# Patient Record
Sex: Female | Born: 1975
Health system: Southern US, Community
[De-identification: ages and names within clinical notes are randomized; demographics above are authoritative.]

## PROBLEM LIST (undated history)

## (undated) ENCOUNTER — Emergency Department (HOSPITAL_COMMUNITY): Disposition: A | Discharge status: Home / Self Care | Payer: Self-pay

## (undated) DIAGNOSIS — M1990 Unspecified osteoarthritis, unspecified site: Secondary | ICD-10-CM

## (undated) DIAGNOSIS — F909 Attention-deficit hyperactivity disorder, unspecified type: Secondary | ICD-10-CM

## (undated) DIAGNOSIS — T8859XA Other complications of anesthesia, initial encounter: Secondary | ICD-10-CM

## (undated) DIAGNOSIS — R519 Headache, unspecified: Secondary | ICD-10-CM

## (undated) DIAGNOSIS — E039 Hypothyroidism, unspecified: Secondary | ICD-10-CM

## (undated) DIAGNOSIS — E079 Disorder of thyroid, unspecified: Secondary | ICD-10-CM

## (undated) HISTORY — PX: OTHER SURGICAL HISTORY: SHX169

## (undated) HISTORY — PX: BREAST SURGERY: SHX581

## (undated) HISTORY — PX: WISDOM TOOTH EXTRACTION: SHX21

## (undated) HISTORY — PX: REDUCTION MAMMAPLASTY: SUR839

---

## 1998-02-25 ENCOUNTER — Emergency Department (HOSPITAL_COMMUNITY): Admission: EM | Admit: 1998-02-25 | Discharge: 1998-02-26 | Payer: Self-pay | Admitting: Emergency Medicine

## 1998-03-06 ENCOUNTER — Emergency Department (HOSPITAL_COMMUNITY): Admission: EM | Admit: 1998-03-06 | Discharge: 1998-03-07 | Payer: Self-pay | Admitting: Emergency Medicine

## 2002-10-26 ENCOUNTER — Encounter: Payer: Self-pay | Admitting: Obstetrics and Gynecology

## 2002-10-26 ENCOUNTER — Ambulatory Visit (HOSPITAL_COMMUNITY): Admission: RE | Admit: 2002-10-26 | Discharge: 2002-10-26 | Payer: Self-pay | Admitting: Obstetrics and Gynecology

## 2003-03-28 ENCOUNTER — Inpatient Hospital Stay (HOSPITAL_COMMUNITY): Admission: AD | Admit: 2003-03-28 | Discharge: 2003-03-28 | Payer: Self-pay | Admitting: Obstetrics and Gynecology

## 2003-03-29 ENCOUNTER — Inpatient Hospital Stay (HOSPITAL_COMMUNITY): Admission: AD | Admit: 2003-03-29 | Discharge: 2003-04-02 | Payer: Self-pay | Admitting: Obstetrics and Gynecology

## 2003-04-03 ENCOUNTER — Encounter: Admission: RE | Admit: 2003-04-03 | Discharge: 2003-05-03 | Payer: Self-pay | Admitting: Obstetrics and Gynecology

## 2003-05-04 ENCOUNTER — Encounter: Admission: RE | Admit: 2003-05-04 | Discharge: 2003-06-03 | Payer: Self-pay | Admitting: Obstetrics and Gynecology

## 2003-07-04 ENCOUNTER — Encounter: Admission: RE | Admit: 2003-07-04 | Discharge: 2003-08-03 | Payer: Self-pay | Admitting: Obstetrics and Gynecology

## 2003-09-03 ENCOUNTER — Encounter: Admission: RE | Admit: 2003-09-03 | Discharge: 2003-10-03 | Payer: Self-pay | Admitting: Obstetrics and Gynecology

## 2003-10-04 ENCOUNTER — Encounter: Admission: RE | Admit: 2003-10-04 | Discharge: 2003-11-03 | Payer: Self-pay | Admitting: Obstetrics and Gynecology

## 2004-10-09 ENCOUNTER — Other Ambulatory Visit: Admission: RE | Admit: 2004-10-09 | Discharge: 2004-10-09 | Payer: Self-pay | Admitting: Obstetrics and Gynecology

## 2005-07-16 ENCOUNTER — Emergency Department (HOSPITAL_COMMUNITY): Admission: EM | Admit: 2005-07-16 | Discharge: 2005-07-17 | Payer: Self-pay | Admitting: Emergency Medicine

## 2005-10-24 ENCOUNTER — Other Ambulatory Visit: Admission: RE | Admit: 2005-10-24 | Discharge: 2005-10-24 | Payer: Self-pay | Admitting: Obstetrics and Gynecology

## 2005-11-12 ENCOUNTER — Encounter: Admission: RE | Admit: 2005-11-12 | Discharge: 2005-11-12 | Payer: Self-pay | Admitting: Obstetrics and Gynecology

## 2006-04-25 ENCOUNTER — Emergency Department (HOSPITAL_COMMUNITY): Admission: EM | Admit: 2006-04-25 | Discharge: 2006-04-25 | Payer: Self-pay | Admitting: Emergency Medicine

## 2009-09-02 ENCOUNTER — Encounter: Payer: Self-pay | Admitting: Internal Medicine

## 2009-10-18 ENCOUNTER — Ambulatory Visit: Payer: Self-pay | Admitting: Internal Medicine

## 2009-10-18 DIAGNOSIS — J45909 Unspecified asthma, uncomplicated: Secondary | ICD-10-CM | POA: Insufficient documentation

## 2009-10-18 DIAGNOSIS — J309 Allergic rhinitis, unspecified: Secondary | ICD-10-CM | POA: Insufficient documentation

## 2009-10-25 ENCOUNTER — Telehealth: Payer: Self-pay | Admitting: Internal Medicine

## 2009-12-26 ENCOUNTER — Telehealth: Payer: Self-pay | Admitting: Internal Medicine

## 2010-03-02 NOTE — Progress Notes (Signed)
Summary: rx sub/ labs   Phone Note Call from Patient Call back at Home Phone [phone]   Caller: Patient-Crystal Parks Call For: young Summary of Call: pt wants to do labs at the same time of her 2 month f/u w/ dr young sched'd for 11/ 25. also wants a sub for the 12 hr. bronch-dialator. (too expensive even w/ ins). [facility] [address].  Initial call taken by: Tivis Ringer, CNA,  October 25, 2009 9:35 AM  Follow-up for Phone Call        [facility] RN  October 25, 2009 10:00 AM LMOMTCB Vernie Murders  October 27, 2009 11:59 AM LMTCBx3. Carron Curie CMA  October 28, 2009 4:04 PM  Pt did not return call so per protocol I will sign off an await a call from pt. Carron Curie CMA  October 28, 2009 5:34 PM

## 2010-03-02 NOTE — Progress Notes (Signed)
Summary: nos appt  Phone Note Call from Patient   Caller: [email or web address]  Call For: young Summary of Call: LMTCB to rsc nos from 11/25. Initial call taken by: Darletta Moll,  December 26, 2009 2:18 PM

## 2010-03-02 NOTE — Assessment & Plan Note (Signed)
Summary: sob/increased episodes of asthma/apc   Primary Provider/Referring Provider:  Benedetto Goad, MD  CC:  Pulmonary Consult Asthma.  History of Present Illness: October 18, 2009- 34yoF never smoker, seen on referral by Dr Andrey Campanile with complaint of asthma/ allergies since she was a teenager. She was hosp only once when violent coughing tore intercostal muscle. Did better in her 11's and almost clear while pregnant. In the last 2-3 years has been better. Can't jog w/out wheeze. In August had a sustained exacerbation with cough and wheeze. Treated at [facility] with prednisone which helped but gained weight and caused mood changes. Does not want prednisone again.Marland Kitchen  Has used albuterol heavily, used Asmanex. Antihistamines "don't help" Flares is felt as itching either in throat or deep in right lower quadrant of abdomen. Denies GERD. Has nasal congestion with sneeze usually when she wheezes. Ears pop, eyes itch. No hx of allergic skin rash. Respiratory symptoms are perennial, worse in dry heat.  Skin tested broadly positive in 2000 by Dr Lucie Leather, no vaccine. Was on allergy vaccine as a child x 7 years.  No difference home or work. Worse lying down. Has encasings, no aircleaners. House is old, crawlspace, wood floors, [place]. Has 4 cats, a dog. Denies smokers, or mold.  Asthma History    Initial Asthma Severity Rating:    Age range: 12+ years    Symptoms: 0-2 days/week    Nighttime Awakenings: 0-2/month    Interferes w/ normal activity: no limitations    SABA use (not for EIB): 0-2 days/week    Asthma Severity Assessment: Intermittent   Preventive Screening-Counseling & Management  Alcohol-Tobacco     Smoking Status: never  Allergies (verified): 1)  ! 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   Risk Factors: Smoking Status: never (10/18/2009)  Past Medical History: History of bronchitis, URI History of cystitis Asthma Rhinitis Hypothyroidisim ? glaucoma- borderline  Past Surgical History: Breast Reduction 1995 C-section  Family History: Grandmother Heart Disease deceased Father Allergies Mother Allergies, Hypothyroid  Social History: Separated with one son Works full time as Theatre manager Patient never smoked.  Smoking Status:  never  Review of Systems      See HPI       The patient complains of shortness of breath with activity, non-productive cough, nasal congestion/difficulty breathing through nose, itching, and anxiety.  The patient denies shortness of breath at rest, productive cough, coughing up blood, chest pain, irregular heartbeats, acid heartburn, indigestion, loss of appetite, weight change, abdominal pain, difficulty swallowing, sore throat, tooth/dental problems, headaches, sneezing, ear ache, depression, hand/feet swelling, joint stiffness or pain, rash, change in color of mucus, and fever.    Vital Signs:  Patient profile:   35 year old female Height:      64 inches Weight:      169 pounds BMI:     29.11 O2 Sat:      99 % on Room air Pulse rate:   101 / minute BP sitting:   120 / 72  (left arm)  Vitals Entered By: Renold Genta RCP, LPN (October 18, 2009 3:40 PM)  O2 Flow:  Room air CC: Pulmonary Consult Asthma Comments Medications reviewed with patient Renold Genta RCP, LPN  October 18, 2009 3:40 PM    Physical Exam  Additional Exam:  General: A/Ox3; pleasant and cooperative, NAD, talkative SKIN: no rash, lesions NODES: no lymphadenopathy HEENT: [place]/AT, EOM- WNL, Conjuctivae- clear, PERRLA, TM-WNL, Nose- mucus bridging, Throat- clear and wnl. Mallampati  II, tonsils NECK: Supple w/ fair ROM, JVD- none, normal carotid impulses w/o bruits Thyroid- normal to  palpation CHEST: Clear to P&A, unlabored, no cough or wheeze HEART: RRR, no m/g/r heard ABDOMEN: Soft and nl; nml bowel sounds; no organomegaly or masses noted ZOX:WRUE, nl pulses, no edema, cyanosis or clubbing  NEURO: Grossly intact to observation      Impression & Recommendations:  Problem # 1:  ALLERGIC RHINITIS (ICD-477.9) Says that saline nasal sprays make her gag. Doesnt identify a season or exposure pattern of significance, emphasizing that nose and chest exacerbations seem random to her. She is financially limited and asking for treatment that will cover asthma and nose. I discussed Singulair and nasal steroids.  Problem # 2:  ASTHMA (ICD-493.90) She describes mild to moderate intermittent asthma. There are probably allergic, viral and environmental triggers. Also likely that occasional reflux and stress are important at times.She has found yoga helpful. She emphasizes that she strongly dislikes needles and doesn't want systemic steroids. We discussed environmental precautions, including her animals. We are checking an IgE level, giving sample/ Rx Advair 100/50, and trying samples Singulair. Discussed use of her rescue inhaler.  Medications Added to Medication List This Visit: 1)  Trazodone Hcl 100 Mg Tabs (Trazodone hcl) .... 1/2 to 1 tablet at bedtime 2)  Levothroid 50 Mcg Tabs (Levothyroxine sodium) .Marland Kitchen.. 1 once daily 3)  Nuvaring 0.12-0.015 Mg/24hr Ring (Etonogestrel-ethinyl estradiol) .... As directed 4)  Ventolin Hfa 108 (90 Base) Mcg/act Aers (Albuterol sulfate) .Marland Kitchen.. 1-2 puffs every 4 hrs as needed 5)  Advair Diskus 100-50 Mcg/dose Aepb (Fluticasone-salmeterol) .Marland Kitchen.. 1 puff and rinse twice daily 6)  Singulair 10 Mg Tabs (Montelukast sodium) .Marland Kitchen.. 1 daily in the evening  Other Orders: Consultation Level III (45409) T-IgE (Immunoglobulin E) ([id number])  Patient Instructions: 1)  Please schedule a follow-up appointment in 2 months 2)  Lab 3)  Sample and script Singulair  10 mg,   1 daily 4)  Sample and script Advair 100/ 50 5)       1 puff and rinse mouth twice daily 6)  Ok to use your Ventolin HFA inhaler for rescue 7)       Up to 2 ouffs, four times a day as needed  Prescriptions: SINGULAIR 10 MG TABS (MONTELUKAST SODIUM) 1 daily in the evening  #30 x prn   Entered and Authorized by:   Waymon Budge MD   Signed by:   Waymon Budge MD on 10/18/2009   Method used:   Print then Give to Patient   RxID:   [id number] ADVAIR DISKUS 100-50 MCG/DOSE AEPB (FLUTICASONE-SALMETEROL) 1 puff and rinse twice daily  #1 x prn   Entered and Authorized by:   Waymon Budge MD   Signed by:   Waymon Budge MD on 10/18/2009   Method used:   Print then Give to Patient   RxID:   [id number]

## 2010-03-03 NOTE — Letter (Signed)
Summary: [facility]   Imported By: Sherian Rein 09/20/2009 10:36:40  _____________________________________________________________________  External Attachment:    Type:   Image     Comment:   External Document

## 2010-06-16 NOTE — Discharge Summary (Signed)
[name], Crystal Parks                        ACCOUNT NO.:  [id number]   MEDICAL RECORD NO.:  [id number]                   PATIENT TYPE:  INP   LOCATION:  9136                                 FACILITY:  [facility]   PHYSICIAN:  Huel Cote, M.D.              DATE OF BIRTH:  [birth year]   DATE OF ADMISSION:  03/29/2003  DATE OF DISCHARGE:  04/02/2003                                 DISCHARGE SUMMARY   DISCHARGE DIAGNOSES:  1. Term pregnancy at 40 weeks, delivered.  2. Arrest of dilation at 7 cm dilation.  3. Group B streptococcus positive.  4. Meconium-stained fluid.  5. Status post primary low transverse cesarean section.   DISCHARGE MEDICATIONS:  1. Motrin 600 mg p.o. q.6h. p.r.n.  2. Percocet one to two tablets p.o. q.4h. p.r.n.   DISCHARGE FOLLOW-UP:  The patient is to follow up in the office in 2 weeks  for an incision check.   HOSPITAL COURSE:  The patient was a 35 year old G1 P0 who was admitted to  the hospital for contractions and pain control in early labor.  She had  uncomplicated prenatal care except for her group B strep positive status  which prevented her from delivering with the birth center.  Prenatal labs  are as follows:  A positive, antibody negative, RPR nonreactive, rubella  immune, hepatitis B surface antigen negative, HIV negative, GC negative,  chlamydia negative, urine positive for group B strep.  On admission she was  afebrile with stable vital signs.  Cardiac exam was regular rate and rhythm,  lungs were clear, abdomen was soft and nontender.  Cervix at that point was  3 cm, 90%, -3 station.  She was admitted for pain control and observation  and continued to labor very slowly throughout the entire night of admission.  The patient declined any intervention with Pitocin or amniotomy despite her  slow progression and was having considerable back pain.  On the morning  following her admission her cervix was completely effaced, 7 cm, -1 station,  with  possible OP presentation.  The patient was at that point agreeable to  proceeding with epidural anesthesia and amniotomy.  Upon performing  amniotomy it was noted the patient had thick meconium and an IUPC with an  amnioinfusion was placed.  When the patient failed to progress she was  placed on Pitocin augmentation and never reached beyond 7 cm of dilation.  At this point the risks and benefits were discussed with the patient and she  agreed to proceed with cesarean section.  She was delivered of a vigorous  female infant from the vertex presentation.  Apgars were 9 and 9, weight was 8  pounds 13 ounces.  The infant did very well and then the patient was  admitted for routine postoperative care.  Her postoperative hemoglobin was  10.0.  By postoperative day #3 she was ambulating, tolerating a regular  diet, and tolerating her p.o.  pain medications, and was felt stable for  discharge home.                                               Huel Cote, M.D.    KR/MEDQ  D:  05/14/2003  T:  05/14/2003  Job:  [id number]

## 2010-06-16 NOTE — Op Note (Signed)
Crystal Parks, Crystal Parks                        ACCOUNT NO.:  [id number]   MEDICAL RECORD NO.:  [id number]                   PATIENT TYPE:  INP   LOCATION:  9136                                 FACILITY:  [facility]   PHYSICIAN:  Huel Cote, M.D.              DATE OF BIRTH:  Dec 15, 1975   DATE OF PROCEDURE:  03/30/2003  DATE OF DISCHARGE:                                 OPERATIVE REPORT   PREOPERATIVE DIAGNOSES:  1. Term pregnancy at 40 weeks, delivered.  2. Arrest of dilation at 7 cm.  3. Group B Streptococcus positive status.   POSTOPERATIVE DIAGNOSES:  1. Term pregnancy at 40 weeks, delivered.  2. Arrest of dilation at 7 cm.  3. Group B Streptococcus positive status.   PROCEDURE:  Primary low transverse cesarean section.   SURGEON:  Huel Cote, M.D.   ANESTHESIA:  Epidural.   FINDINGS:  A vigorous female infant, Apgars were 9 and 9, weight was 8 pounds  13 ounces.  Tubes and ovaries were normal.  There was a small serosal  fibroid on the uterine fundus approximately 2 cm; otherwise, the uterus was  normal.   FLUIDS:  Estimated blood loss 700 mL, urine output 200 mL, IV fluids 2300  mL.   DESCRIPTION OF PROCEDURE:  The patient was taken to the operating room,  where epidural anesthesia was found to be adequate by Allis clamp test.  She  was then prepped and draped in normal sterile fashion in the dorsal supine  position with a leftward tilt.  A Pfannenstiel skin incision was then made  with a scalpel and carried through to the underlying layer of fascia by  sharp dissection and Bovie cautery.  The fascia was then nicked in the  midline and the incision was extended laterally with Mayo scissors.  The  inferior aspect was grasped with Kocher clamps, elevated, and dissected off  the underlying rectus muscles.  In a similar fashion the superior aspect was  elevated and grasped and dissected off the rectus muscles.  The rectus  muscles were separated in the midline and the  peritoneal cavity entered  bluntly.  The peritoneal incision was then extended both superiorly and  inferiorly with careful attention to avoid the bowel and bladder.  The  bladder blade was then inserted and the vesicouterine peritoneum identified  and the bladder flap created with Metzenbaum scissors.  With the bladder  flap then placed under the bladder blade, the lower uterine segment was  incised in a transverse fashion.  The cavity itself was entered bluntly and  the incision was extended bluntly.  The infant's head was then delivered  atraumatically and DeLee suctioned, for meconium had been noted in labor;  however, this had cleared substantially with amnioinfusion throughout the  day.  The remainder of the infant's body was delivered, cord clamped and  cut, and handed to the waiting pediatricians.  The placenta was then  delivered manually and the uterus cleared of all clots and debris with moist  lap sponge.  The uterine incision was then grasped with a ring forceps at  each angle and Pitocin infused with the boggy uterus firming up with  bimanual massage.  The uterine incision was then repaired with 0 chromic in  a running locked fashion.  One additional figure-of-eight suture was placed  in the midline for good hemostasis.  There was no active bleeding noted at  this point, and tubes and ovaries were inspected and found to be normal and  the incision completely hemostatic.  All instruments and sponges were  removed from the patient's abdomen and the rectus muscles were inspected as  well as the subfascial plane, and these were found to be hemostatic.  The  rectus muscles were then reapproximated with several interrupted sutures of  0 Vicryl.  The fascia was then closed with 0 Vicryl in a running fashion.  The subcutaneous tissue was then reapproximated with 0 chromic in a running  fashion and the skin was closed with staples.  Sponge, lap, and needle  counts were correct x2, and  the patient was taken to the recovery room in  stable condition.                                               Huel Cote, M.D.    KR/MEDQ  D:  03/30/2003  T:  03/31/2003  Job:  [id number]

## 2011-04-02 ENCOUNTER — Emergency Department (HOSPITAL_COMMUNITY): Payer: PRIVATE HEALTH INSURANCE

## 2011-04-02 ENCOUNTER — Emergency Department (HOSPITAL_COMMUNITY)
Admission: EM | Admit: 2011-04-02 | Discharge: 2011-04-02 | Disposition: A | Discharge status: Home / Self Care | Payer: PRIVATE HEALTH INSURANCE | Attending: Emergency Medicine | Admitting: Emergency Medicine

## 2011-04-02 ENCOUNTER — Encounter (HOSPITAL_COMMUNITY): Payer: Self-pay | Admitting: *Deleted

## 2011-04-02 ENCOUNTER — Other Ambulatory Visit: Payer: Self-pay

## 2011-04-02 DIAGNOSIS — G43809 Other migraine, not intractable, without status migrainosus: Secondary | ICD-10-CM | POA: Insufficient documentation

## 2011-04-02 DIAGNOSIS — H53489 Generalized contraction of visual field, unspecified eye: Secondary | ICD-10-CM | POA: Insufficient documentation

## 2011-04-02 DIAGNOSIS — G43109 Migraine with aura, not intractable, without status migrainosus: Secondary | ICD-10-CM

## 2011-04-02 HISTORY — DX: Disorder of thyroid, unspecified: E07.9

## 2011-04-02 HISTORY — DX: Attention-deficit hyperactivity disorder, unspecified type: F90.9

## 2011-04-02 LAB — RAPID URINE DRUG SCREEN, HOSP PERFORMED
Barbiturates: NOT DETECTED
Cocaine: NOT DETECTED
Opiates: NOT DETECTED

## 2011-04-02 LAB — POCT PREGNANCY, URINE: Preg Test, Ur: NEGATIVE

## 2011-04-02 LAB — TROPONIN I: Troponin I: <0.3 ng/mL (ref <0.30)

## 2011-04-02 LAB — COMPREHENSIVE METABOLIC PANEL
ALT: 16 U/L (ref 0–35)
Alkaline Phosphatase: 57 U/L (ref 39–117)
CO2: 22 mEq/L (ref 19–32)
GFR calc Af Amer: >90 mL/min (ref >90)
Glucose, Bld: 95 mg/dL (ref 70–99)
Potassium: 3.9 mEq/L (ref 3.5–5.1)
Sodium: 137 mEq/L (ref 135–145)
Total Protein: 6.9 g/dL (ref 6.0–8.3)

## 2011-04-02 LAB — PROTIME-INR
INR: 0.91 (ref 0.00–1.49)
Prothrombin Time: 12.5 seconds (ref 11.6–15.2)

## 2011-04-02 LAB — URINALYSIS, ROUTINE W REFLEX MICROSCOPIC
Bilirubin Urine: NEGATIVE
Hgb urine dipstick: NEGATIVE
Nitrite: NEGATIVE
Specific Gravity, Urine: 1.008 (ref 1.005–1.030)
pH: 7 (ref 5.0–8.0)

## 2011-04-02 LAB — GLUCOSE, CAPILLARY

## 2011-04-02 LAB — POCT I-STAT, CHEM 8
Chloride: 107 mEq/L (ref 96–112)
Glucose, Bld: 96 mg/dL (ref 70–99)
HCT: 42 % (ref 36.0–46.0)
Potassium: 4 mEq/L (ref 3.5–5.1)

## 2011-04-02 LAB — CBC
Platelets: 284 10*3/uL (ref 150–400)
RBC: 4.73 MIL/uL (ref 3.87–5.11)
WBC: 7.9 10*3/uL (ref 4.0–10.5)

## 2011-04-02 LAB — DIFFERENTIAL
Eosinophils Absolute: 0.3 10*3/uL (ref 0.0–0.7)
Lymphocytes Relative: 40 % (ref 12–46)
Lymphs Abs: 3.2 10*3/uL (ref 0.7–4.0)
Neutro Abs: 3.8 10*3/uL (ref 1.7–7.7)
Neutrophils Relative %: 49 % (ref 43–77)

## 2011-04-02 MED ORDER — METOCLOPRAMIDE HCL 10 MG PO TABS: 10.0000 mg | ORAL_TABLET | Freq: Four times a day (QID) | ORAL | Status: DC | PRN

## 2011-04-02 MED ORDER — SODIUM CHLORIDE 0.9 % IV BOLUS (SEPSIS)
1000.0000 mL | Freq: Once | INTRAVENOUS | Status: AC
  Administered 2011-04-02: 1000 mL via INTRAVENOUS

## 2011-04-02 MED ORDER — METOCLOPRAMIDE HCL 5 MG/ML IJ SOLN
10.0000 mg | Freq: Once | INTRAMUSCULAR | Status: AC
  Administered 2011-04-02: 10 mg via INTRAVENOUS
  Filled 2011-04-02: qty 2

## 2011-04-02 MED ORDER — DIPHENHYDRAMINE HCL 50 MG/ML IJ SOLN
25.0000 mg | Freq: Once | INTRAMUSCULAR | Status: AC
  Administered 2011-04-02: 25 mg via INTRAVENOUS
  Filled 2011-04-02: qty 1

## 2011-04-02 NOTE — ED Notes (Signed)
CBg 102 

## 2011-04-02 NOTE — ED Notes (Signed)
Pt arrived at 0946, Pt arrived in CT at 0949, Lab arrived at [phone].  Code stroke cancelled because stroke ruled out at 1003 by Dr. Antionette Char

## 2011-04-02 NOTE — ED Notes (Signed)
Code Stroke cancelled at 1003

## 2011-04-02 NOTE — Consult Note (Signed)
Chief Complaint: "peripheral vision loss"  HPI: Crystal Parks is an 36 y.o. female who has had a lot of stress in her life with a relative and friend being close to passing away who had a sudden loss of her peripheral vision. She was at a stress management meeting when her symptoms started. There were also reports of flashes of light in her eyes without a headache.   LSN: 9:00 am tPA Given: No: low NIHSS of 0 mRankin: 0  Past Medical History  Diagnosis Date  . Thyroid disease   . ADHD (attention deficit hyperactivity disorder)    Past Surgical History  Procedure Date  . Breast surgery   . Ceas     c-section   No family history on file. Social History:  reports that she has quit smoking. She does not have any smokeless tobacco history on file. She reports that she drinks alcohol. She reports that she does not use illicit drugs.  Allergies:  Allergies  Allergen Reactions  . Ciprofloxacin Itching, Nausea And Vomiting and Rash    Halucinations   Medications: I have reviewed the patient's current medications.  ROS: As above  Physical Examination: Blood pressure 100/59, pulse 70, temperature 98.4 F (36.9 C), temperature source Oral, resp. rate 16, last menstrual period 03/18/2011, SpO2 100.00%.  Neurologic Examination: Neurological exam: AAO*3. No aphasia.  Was able to tell me months of the year forwards and backwards correctly, exhibiting good attention span. Recall was 3 of 3 after 5 minutes. Followed complex commands. Cranial nerves: EOMI, PERRL. Visual fields were full. Sensation to V1 through V3 areas of the face was intact and symmetric throughout. There was no facial asymmetry. Hearing to finger rub was equal and symmetrical bilaterally. Shoulder shrug was 5/5 and symmetric bilaterally. Head rotation was 5/5 bilaterally. There was no dysarthria or palatal deviation. Eliciting maneuvers: Straight leg raise was negative bilaterally. Spurling maneuvers were performed in the  patient and were negative. The patient also had Tinel's and Phalen's maneuvers performed at the wrists, which were negative. Motor: strength was 5/5 and symmetric throughout. Sensory: was intact throughout to light touch, pinprick, vibration and proprioception. Coordination: finger-to-nose and heel-to-shin were intact and symmetric bilaterally. Reflexes: were 2+ in upper extremities and 1+ at the knees and 1+ at the ankles. Plantar response was downgoing bilaterally. Gait: deferred  Results for orders placed during the hospital encounter of 04/02/11 (from the past 48 hour(s))  PROTIME-INR     Status: Normal   Collection Time   04/02/11  9:58 AM      Component Value Range Comment   Prothrombin Time 12.5  11.6 - 15.2 (seconds)    INR 0.91  0.00 - 1.49    APTT     Status: Normal   Collection Time   04/02/11  9:58 AM      Component Value Range Comment   aPTT 25  24 - 37 (seconds)   CBC     Status: Normal   Collection Time   04/02/11  9:58 AM      Component Value Range Comment   WBC 7.9  4.0 - 10.5 (K/uL)    RBC 4.73  3.87 - 5.11 (MIL/uL)    Hemoglobin 14.1  12.0 - 15.0 (g/dL)    HCT 40.9  81.1 - 91.4 (%)    MCV 86.9  78.0 - 100.0 (fL)    MCH 29.8  26.0 - 34.0 (pg)    MCHC 34.3  30.0 - 36.0 (g/dL)  RDW 12.6  11.5 - 15.5 (%)    Platelets 284  150 - 400 (K/uL)   DIFFERENTIAL     Status: Normal   Collection Time   04/02/11  9:58 AM      Component Value Range Comment   Neutrophils Relative 49  43 - 77 (%)    Neutro Abs 3.8  1.7 - 7.7 (K/uL)    Lymphocytes Relative 40  12 - 46 (%)    Lymphs Abs 3.2  0.7 - 4.0 (K/uL)    Monocytes Relative 6  3 - 12 (%)    Monocytes Absolute 0.5  0.1 - 1.0 (K/uL)    Eosinophils Relative 4  0 - 5 (%)    Eosinophils Absolute 0.3  0.0 - 0.7 (K/uL)    Basophils Relative 0  0 - 1 (%)    Basophils Absolute 0.0  0.0 - 0.1 (K/uL)   COMPREHENSIVE METABOLIC PANEL     Status: Abnormal   Collection Time   04/02/11  9:58 AM      Component Value Range Comment   Sodium 137   135 - 145 (mEq/L)    Potassium 3.9  3.5 - 5.1 (mEq/L)    Chloride 105  96 - 112 (mEq/L)    CO2 22  19 - 32 (mEq/L)    Glucose, Bld 95  70 - 99 (mg/dL)    BUN 12  6 - 23 (mg/dL)    Creatinine, Ser 1.61  0.50 - 1.10 (mg/dL)    Calcium 09.6  8.4 - 10.5 (mg/dL)    Total Protein 6.9  6.0 - 8.3 (g/dL)    Albumin 3.7  3.5 - 5.2 (g/dL)    AST 17  0 - 37 (U/L)    ALT 16  0 - 35 (U/L)    Alkaline Phosphatase 57  39 - 117 (U/L)    Total Bilirubin 0.4  0.3 - 1.2 (mg/dL)    GFR calc non Af Amer 79 (*) >90 (mL/min)    GFR calc Af Amer >90  >90 (mL/min)   CK TOTAL AND CKMB     Status: Normal   Collection Time   04/02/11  9:59 AM      Component Value Range Comment   Total CK 115  7 - 177 (U/L)    CK, MB 2.1  0.3 - 4.0 (ng/mL)    Relative Index 1.8  0.0 - 2.5    TROPONIN I     Status: Normal   Collection Time   04/02/11  9:59 AM      Component Value Range Comment   Troponin I <0.30  <0.30 (ng/mL)   POCT I-STAT, CHEM 8     Status: Normal   Collection Time   04/02/11 10:02 AM      Component Value Range Comment   Sodium 139  135 - 145 (mEq/L)    Potassium 4.0  3.5 - 5.1 (mEq/L)    Chloride 107  96 - 112 (mEq/L)    BUN 12  6 - 23 (mg/dL)    Creatinine, Ser 0.45  0.50 - 1.10 (mg/dL)    Glucose, Bld 96  70 - 99 (mg/dL)    Calcium, Ion 4.09  1.12 - 1.32 (mmol/L)    TCO2 22  0 - 100 (mmol/L)    Hemoglobin 14.3  12.0 - 15.0 (g/dL)    HCT 81.1  91.4 - 78.2 (%)   GLUCOSE, CAPILLARY     Status: Abnormal   Collection Time  04/02/11 10:09 AM      Component Value Range Comment   Glucose-Capillary 102 (*) 70 - 99 (mg/dL)    Comment 1 Documented in Chart      Comment 2 Notify RN     URINE RAPID DRUG SCREEN (HOSP PERFORMED)     Status: Normal   Collection Time   04/02/11 10:20 AM      Component Value Range Comment   Opiates NONE DETECTED  NONE DETECTED     Cocaine NONE DETECTED  NONE DETECTED     Benzodiazepines NONE DETECTED  NONE DETECTED     Amphetamines NONE DETECTED  NONE DETECTED      Tetrahydrocannabinol NONE DETECTED  NONE DETECTED     Barbiturates NONE DETECTED  NONE DETECTED    URINALYSIS, ROUTINE W REFLEX MICROSCOPIC     Status: Normal   Collection Time   04/02/11 10:20 AM      Component Value Range Comment   Color, Urine YELLOW  YELLOW     APPearance CLEAR  CLEAR     Specific Gravity, Urine 1.008  1.005 - 1.030     pH 7.0  5.0 - 8.0     Glucose, UA NEGATIVE  NEGATIVE (mg/dL)    Hgb urine dipstick NEGATIVE  NEGATIVE     Bilirubin Urine NEGATIVE  NEGATIVE     Ketones, ur NEGATIVE  NEGATIVE (mg/dL)    Protein, ur NEGATIVE  NEGATIVE (mg/dL)    Urobilinogen, UA 0.2  0.0 - 1.0 (mg/dL)    Nitrite NEGATIVE  NEGATIVE     Leukocytes, UA NEGATIVE  NEGATIVE  MICROSCOPIC NOT DONE ON URINES WITH NEGATIVE PROTEIN, BLOOD, LEUKOCYTES, NITRITE, OR GLUCOSE <1000 mg/dL.  POCT PREGNANCY, URINE     Status: Normal   Collection Time   04/02/11 10:25 AM      Component Value Range Comment   Preg Test, Ur NEGATIVE  NEGATIVE     Ct Head Wo Contrast  04/02/2011  *RADIOLOGY REPORT*  Clinical Data: Code stroke, tunnel vision.  CT HEAD WITHOUT CONTRAST  Technique:  Contiguous axial images were obtained from the base of the skull through the vertex without contrast.  Comparison: None.  Findings: No evidence of acute infarct, acute hemorrhage, mass lesion, mass effect or hydrocephalus.  Visualized portions of the paranasal sinuses and mastoid air cells are clear.  IMPRESSION: Negative. These results were called by telephone on 04/02/2011  at 1002 hours to  Dr. Lyman Speller, who verbally acknowledged these results.  Original Report Authenticated By: Reyes Ivan, M.D.   Assessment: 36 y.o. female with tunnel vision who had a lot of stress and anxiety lately who was brought in as a stroke code with no t-PA given - presentation highly suspicious for conversion reaction. Tunnel vision is classically a presentation of hysteria.  I doubt ocular migraine as she has no history of migraines, but if she has  recurrence of symptoms, she can follow-up with neurology.  Patient is currently back to baseline. Can follow-up with her medical doctor and neurology as needed.   Zaven Klemens 04/02/2011, 12:08 PM

## 2011-04-02 NOTE — ED Notes (Signed)
Pt was in a meeting and had sudden loss of vision followed by seeing lines and then lost peripherial at 0900.  Pt reports flashing lights.  No slurred speech and follows commands.  Pt did have nausea and dizziness.  Pt had tremulous tremor in left hand.  Pt went directly to CT and is back now

## 2011-04-02 NOTE — ED Provider Notes (Signed)
History     CSN: [id number]  Arrival date & time 04/02/11  4098   First MD Initiated Contact with Patient 04/02/11 [phone]      No chief complaint on file.   (Consider location/radiation/quality/duration/timing/severity/associated sxs/prior treatment) The history is provided by the EMS personnel and the patient.   36 year old female noted onset at 9 AM of loss of vision. She denies headache. She did notice some shaking of her right arm which she was able to control if she put it behind her back. EMS also reported what they state was a focal seizure involving the left arm. She denies any weakness or numbness or tingling. She denies any chest pain, nausea, vomiting. Symptoms are moderate. Nothing makes them better and nothing makes them worse. She was brought in via ambulance as a code stroke. She takes amphetamine salts for attention deficit disorder. She states that she has been diagnosed with ADD in the past and had been prescribed Adderall but could not afford it.  No past medical history on file.  No past surgical history on file.  No family history on file.  History  Substance Use Topics  . Smoking status: Not on file  . Smokeless tobacco: Not on file  . Alcohol Use: Not on file    OB History    No data available      Review of Systems  All other systems reviewed and are negative.    Allergies  Ciprofloxacin  Home Medications  No current outpatient prescriptions on file.  There were no vitals taken for this visit.  Physical Exam  Nursing note and vitals reviewed.  36 year old female who is resting comfortably and in no acute distress. Vital signs are normal. Oxygen saturation is 98% which is normal. Head is normocephalic and atraumatic. PERRLA, EOMI. There is no facial asymmetry. Tongue protrudes in the midline. Oropharynx is clear. Neck is nontender and supple without adenopathy, JVD, or bruit. Back is nontender. Lungs are clear without rales, wheezes, rhonchi. Heart  has regular rate and rhythm without murmur. Abdomen soft, flat, nontender without masses or hepatosplenomegaly. Extremities have no cyanosis or edema, full range of motion is present. Skin is warm and dry without rash. Neurologic: Mental status is normal, cranial nerves are intact. Visual fields are intact to confrontation. There no focal motor or sensory deficits. No seizure activity is seen.  ED Course  Procedures (including critical care time)  Results for orders placed during the hospital encounter of 04/02/11  PROTIME-INR      Component Value Range   Prothrombin Time 12.5  11.6 - 15.2 (seconds)   INR 0.91  0.00 - 1.49   APTT      Component Value Range   aPTT 25  24 - 37 (seconds)  CBC      Component Value Range   WBC 7.9  4.0 - 10.5 (K/uL)   RBC 4.73  3.87 - 5.11 (MIL/uL)   Hemoglobin 14.1  12.0 - 15.0 (g/dL)   HCT 47.8  29.5 - 62.1 (%)   MCV 86.9  78.0 - 100.0 (fL)   MCH 29.8  26.0 - 34.0 (pg)   MCHC 34.3  30.0 - 36.0 (g/dL)   RDW 30.8  65.7 - 84.6 (%)   Platelets 284  150 - 400 (K/uL)  DIFFERENTIAL      Component Value Range   Neutrophils Relative 49  43 - 77 (%)   Neutro Abs 3.8  1.7 - 7.7 (K/uL)   Lymphocytes Relative 40  12 - 46 (%)   Lymphs Abs 3.2  0.7 - 4.0 (K/uL)   Monocytes Relative 6  3 - 12 (%)   Monocytes Absolute 0.5  0.1 - 1.0 (K/uL)   Eosinophils Relative 4  0 - 5 (%)   Eosinophils Absolute 0.3  0.0 - 0.7 (K/uL)   Basophils Relative 0  0 - 1 (%)   Basophils Absolute 0.0  0.0 - 0.1 (K/uL)  COMPREHENSIVE METABOLIC PANEL      Component Value Range   Sodium 137  135 - 145 (mEq/L)   Potassium 3.9  3.5 - 5.1 (mEq/L)   Chloride 105  96 - 112 (mEq/L)   CO2 22  19 - 32 (mEq/L)   Glucose, Bld 95  70 - 99 (mg/dL)   BUN 12  6 - 23 (mg/dL)   Creatinine, Ser 4.09  0.50 - 1.10 (mg/dL)   Calcium 81.1  8.4 - 10.5 (mg/dL)   Total Protein 6.9  6.0 - 8.3 (g/dL)   Albumin 3.7  3.5 - 5.2 (g/dL)   AST 17  0 - 37 (U/L)   ALT 16  0 - 35 (U/L)   Alkaline Phosphatase 57   39 - 117 (U/L)   Total Bilirubin 0.4  0.3 - 1.2 (mg/dL)   GFR calc non Af Amer 79 (*) >90 (mL/min)   GFR calc Af Amer >90  >90 (mL/min)  CK TOTAL AND CKMB      Component Value Range   Total CK 115  7 - 177 (U/L)   CK, MB 2.1  0.3 - 4.0 (ng/mL)   Relative Index 1.8  0.0 - 2.5   TROPONIN I      Component Value Range   Troponin I <0.30  <0.30 (ng/mL)  URINE RAPID DRUG SCREEN (HOSP PERFORMED)      Component Value Range   Opiates NONE DETECTED  NONE DETECTED    Cocaine NONE DETECTED  NONE DETECTED    Benzodiazepines NONE DETECTED  NONE DETECTED    Amphetamines NONE DETECTED  NONE DETECTED    Tetrahydrocannabinol NONE DETECTED  NONE DETECTED    Barbiturates NONE DETECTED  NONE DETECTED   URINALYSIS, ROUTINE W REFLEX MICROSCOPIC      Component Value Range   Color, Urine YELLOW  YELLOW    APPearance CLEAR  CLEAR    Specific Gravity, Urine 1.008  1.005 - 1.030    pH 7.0  5.0 - 8.0    Glucose, UA NEGATIVE  NEGATIVE (mg/dL)   Hgb urine dipstick NEGATIVE  NEGATIVE    Bilirubin Urine NEGATIVE  NEGATIVE    Ketones, ur NEGATIVE  NEGATIVE (mg/dL)   Protein, ur NEGATIVE  NEGATIVE (mg/dL)   Urobilinogen, UA 0.2  0.0 - 1.0 (mg/dL)   Nitrite NEGATIVE  NEGATIVE    Leukocytes, UA NEGATIVE  NEGATIVE   POCT I-STAT, CHEM 8      Component Value Range   Sodium 139  135 - 145 (mEq/L)   Potassium 4.0  3.5 - 5.1 (mEq/L)   Chloride 107  96 - 112 (mEq/L)   BUN 12  6 - 23 (mg/dL)   Creatinine, Ser 9.14  0.50 - 1.10 (mg/dL)   Glucose, Bld 96  70 - 99 (mg/dL)   Calcium, Ion 7.82  9.56 - 1.32 (mmol/L)   TCO2 22  0 - 100 (mmol/L)   Hemoglobin 14.3  12.0 - 15.0 (g/dL)   HCT 21.3  08.6 - 57.8 (%)  POCT PREGNANCY, URINE  Component Value Range   Preg Test, Ur NEGATIVE  NEGATIVE    Ct Head Wo Contrast  04/02/2011  *RADIOLOGY REPORT*  Clinical Data: Code stroke, tunnel vision.  CT HEAD WITHOUT CONTRAST  Technique:  Contiguous axial images were obtained from the base of the skull through the vertex without  contrast.  Comparison: None.  Findings: No evidence of acute infarct, acute hemorrhage, mass lesion, mass effect or hydrocephalus.  Visualized portions of the paranasal sinuses and mastoid air cells are clear.  IMPRESSION: Negative. These results were called by telephone on 04/02/2011  at 1002 hours to  Dr. Lyman Speller, who verbally acknowledged these results.  Original Report Authenticated By: Reyes Ivan, M.D.      ECG shows normal sinus rhythm with a rate of 71, no ectopy. Normal axis. Normal P wave. Normal QRS. Normal intervals. Normal ST and T waves. Impression: normal ECG. No old ECG available for comparison.  The patient was seen along with Dr. Lyman Speller of [facility]. He has canceled the code stroke or because he does not feel she has a primary neurologic problem.  1030: Patient is a reexamined and she states that she was originally seeing flashes of light which have gone away. She is now seeing negative images of things. Funduscopic exam shows no hemorrhage, exudate, or papilledema but she states that she is having some pain related to the light being shined in her eye. At this point, I am wondering if she might have a migraine variant and she will be treated empirically with IV Reglan and Benadryl.  1120: After IV fluids and IV Reglan and Benadryl, her vision is back to normal.  1. Ocular migraine       MDM  Acute visual loss and possible seizure. CT scan is obtained as part of code stroke protocol.        Dione Booze, MD 04/02/11 1124

## 2011-04-02 NOTE — Discharge Instructions (Signed)
Migraine Headache A migraine headache is an intense, throbbing pain on one or both sides of your head. The exact cause of a migraine headache is not always known. A migraine may be caused when nerves in the brain become irritated and release chemicals that cause swelling within blood vessels, causing pain. Many migraine sufferers have a family history of migraines. Before you get a migraine you may or may not get an aura. An aura is a group of symptoms that can predict the beginning of a migraine. An aura may include:  Visual changes such as:   Flashing lights.   Bright spots or zig-zag lines.   Tunnel vision.   Feelings of numbness.   Trouble talking.   Muscle weakness.  SYMPTOMS  Pain on one or both sides of your head.   Pain that is pulsating or throbbing in nature.   Pain that is severe enough to prevent daily activities.   Pain that is aggravated by any daily physical activity.   Nausea (feeling sick to your stomach), vomiting, or both.   Pain with exposure to bright lights, loud noises, or activity.   General sensitivity to bright lights or loud noises.  MIGRAINE TRIGGERS Examples of triggers of migraine headaches include:   Alcohol.   Smoking.   Stress.   It may be related to menses (female menstruation).   Aged cheeses.   Foods or drinks that contain nitrates, glutamate, aspartame, or tyramine.   Lack of sleep.   Chocolate.   Caffeine.   Hunger.   Medications such as nitroglycerine (used to treat chest pain), birth control pills, estrogen, and some blood pressure medications.  DIAGNOSIS  A migraine headache is often diagnosed based on:  Symptoms.   Physical examination.   A computerized X-ray scan (computed tomography, CT) of your head.  TREATMENT  Medications can help prevent migraines if they are recurrent or should they become recurrent. Your caregiver can help you with a medication or treatment program that will be helpful to you.   Lying  down in a dark, quiet room may be helpful.   Keeping a headache diary may help you find a trend as to what may be triggering your headaches.  SEEK IMMEDIATE MEDICAL CARE IF:   You have confusion, personality changes or seizures.   You have headaches that wake you from sleep.   You have an increased frequency in your headaches.   You have a stiff neck.   You have a loss of vision.   You have muscle weakness.   You start losing your balance or have trouble walking.   You feel faint or pass out.  MAKE SURE YOU:   Understand these instructions.   Will watch your condition.   Will get help right away if you are not doing well or get worse.  Document Released: 01/15/2005 Document Revised: 01/04/2011 Document Reviewed: 08/31/2008 [facility] Patient Information 2012 [place], [place].  Metoclopramide tablets What is this medicine? METOCLOPRAMIDE (met oh kloe PRA mide) is used to treat the symptoms of gastroesophageal reflux disease (GERD) like heartburn. It is also used to treat people with slow emptying of the stomach and intestinal tract. This medicine may be used for other purposes; ask your health care provider or pharmacist if you have questions. What should I tell my health care provider before I take this medicine? They need to know if you have any of these conditions: -breast cancer -depression -diabetes -heart failure -high blood pressure -kidney disease -liver disease -Parkinson's disease  or a movement disorder -pheochromocytoma -seizures -stomach obstruction, bleeding, or perforation -an unusual or allergic reaction to metoclopramide, procainamide, sulfites, other medicines, foods, dyes, or preservatives -pregnant or trying to get pregnant -breast-feeding How should I use this medicine? Take this medicine by mouth with a glass of water. Follow the directions on the prescription label. Take this medicine on an empty stomach, about 30 minutes before eating. Take your  doses at regular intervals. Do not take your medicine more often than directed. Do not stop taking except on the advice of your doctor or health care professional. A special MedGuide will be given to you by the pharmacist with each prescription and refill. Be sure to read this information carefully each time. Talk to your pediatrician regarding the use of this medicine in children. Special care may be needed. Overdosage: If you think you have taken too much of this medicine contact a poison control center or emergency room at once. NOTE: This medicine is only for you. Do not share this medicine with others. What if I miss a dose? If you miss a dose, take it as soon as you can. If it is almost time for your next dose, take only that dose. Do not take double or extra doses. What may interact with this medicine? -acetaminophen -cyclosporine -digoxin -medicines for blood pressure -medicines for diabetes, including insulin -medicines for hay fever and other allergies -medicines for depression, especially an Monoamine Oxidase Inhibitor (MAOI) -medicines for Parkinson's disease, like levodopa -medicines for sleep or for pain -tetracycline This list may not describe all possible interactions. Give your health care provider a list of all the medicines, herbs, non-prescription drugs, or dietary supplements you use. Also tell them if you smoke, drink alcohol, or use illegal drugs. Some items may interact with your medicine. What should I watch for while using this medicine? It may take a few weeks for your stomach condition to start to get better. However, do not take this medicine for longer than 12 weeks. The longer you take this medicine, and the more you take it, the greater your chances are of developing serious side effects. If you are an elderly patient, a female patient, or you have diabetes, you may be at an increased risk for side effects from this medicine. Contact your doctor immediately if you  start having movements you cannot control such as lip smacking, rapid movements of the tongue, involuntary or uncontrollable movements of the eyes, head, arms and legs, or muscle twitches and spasms. Patients and their families should watch out for worsening depression or thoughts of suicide. Also watch out for any sudden or severe changes in feelings such as feeling anxious, agitated, panicky, irritable, hostile, aggressive, impulsive, severely restless, overly excited and hyperactive, or not being able to sleep. If this happens, especially at the beginning of treatment or after a change in dose, call your doctor. Do not treat yourself for high fever. Ask your doctor or health care professional for advice. You may get drowsy or dizzy. Do not drive, use machinery, or do anything that needs mental alertness until you know how this drug affects you. Do not stand or sit up quickly, especially if you are an older patient. This reduces the risk of dizzy or fainting spells. Alcohol can make you more drowsy and dizzy. Avoid alcoholic drinks. What side effects may I notice from receiving this medicine? Side effects that you should report to your doctor or health care professional as soon as possible: -allergic reactions like  skin rash, itching or hives, swelling of the face, lips, or tongue -abnormal production of milk in females -breast enlargement in both males and females -change in the way you walk -difficulty moving, speaking or swallowing -drooling, lip smacking, or rapid movements of the tongue -excessive sweating -fever -involuntary or uncontrollable movements of the eyes, head, arms and legs -irregular heartbeat or palpitations -muscle twitches and spasms -unusually weak or tired Side effects that usually do not require medical attention (report to your doctor or health care professional if they continue or are bothersome): -change in sex drive or performance -depressed  mood -diarrhea -difficulty sleeping -headache -menstrual changes -restless or nervous This list may not describe all possible side effects. Call your doctor for medical advice about side effects. You may report side effects to FDA at 1-800-FDA-1088. Where should I keep my medicine? Keep out of the reach of children. Store at room temperature between 20 and 25 degrees C (68 and 77 degrees F). Protect from light. Keep container tightly closed. Throw away any unused medicine after the expiration date. NOTE: This sheet is a summary. It may not cover all possible information. If you have questions about this medicine, talk to your doctor, pharmacist, or health care provider.  2012, [employer] Standard. (09/10/2007 4:30:05 PM)

## 2011-06-30 ENCOUNTER — Ambulatory Visit (INDEPENDENT_AMBULATORY_CARE_PROVIDER_SITE_OTHER): Payer: PRIVATE HEALTH INSURANCE | Admitting: Family Medicine

## 2011-06-30 VITALS — BP 113/75 | HR 102 | Temp 99.1°F | Resp 16 | Ht 64.0 in | Wt 173.0 lb

## 2011-06-30 DIAGNOSIS — S39011A Strain of muscle, fascia and tendon of abdomen, initial encounter: Secondary | ICD-10-CM

## 2011-06-30 DIAGNOSIS — IMO0001 Reserved for inherently not codable concepts without codable children: Secondary | ICD-10-CM

## 2011-06-30 DIAGNOSIS — M791 Myalgia, unspecified site: Secondary | ICD-10-CM

## 2011-06-30 DIAGNOSIS — IMO0002 Reserved for concepts with insufficient information to code with codable children: Secondary | ICD-10-CM

## 2011-06-30 NOTE — Progress Notes (Signed)
[facility] at [facility]  Patient Name: Crystal Parks Date of Birth: 1975-11-07 Medical Record Number: [id number] Gender: female Date of Encounter: 06/30/2011  History of Present Illness:  Crystal Parks is a 36 y.o. very pleasant female patient who presents with the following:  She had a c-section about 8 years ago.  She had not exercised much since- specifically she has not practiced martial arts.  Then she decided that she wanted to get back into doing martial arts and started back doing classes/ work outs a week ago- got a very intense abdominal work- out. She felt sore, especially in her abs.  Her soreness got better and she tried to do class again a few days ago- she was not able to do some of the abdominal exercises such as leg lifts due to pain in her abs.  She is worried that she has injured herself.  She does not have pain at rest- only if she uses her abdominal muscles  She has no GI symptoms, no urinary symptoms, no vaginal symptoms  Patient Active Problem List  Diagnoses  . ALLERGIC RHINITIS  . ASTHMA   Past Medical History  Diagnosis Date  . Thyroid disease   . ADHD (attention deficit hyperactivity disorder)    Past Surgical History  Procedure Date  . Breast surgery   . Ceas     c-section   History  Substance Use Topics  . Smoking status: Former Games developer  . Smokeless tobacco: Not on file  . Alcohol Use: Yes     occ   No family history on file. Allergies  Allergen Reactions  . Ciprofloxacin Itching, Nausea And Vomiting and Rash    Halucinations    Medication list has been reviewed and updated.  Prior to Admission medications   Medication Sig Start Date End Date Taking? Authorizing Provider  etonogestrel-ethinyl estradiol (NUVARING) 0.12-0.015 MG/24HR vaginal ring Place 1 each vaginally every 28 (twenty-eight) days. Insert vaginally and leave in place for 3 consecutive weeks, then remove for 1 week.   Yes Historical Provider, MD  levothyroxine  (SYNTHROID, LEVOTHROID) 88 MCG tablet Take 88 mcg by mouth daily.   Yes Historical Provider, MD  amphetamine-dextroamphetamine (ADDERALL) 10 MG tablet Take 10 mg by mouth daily.    Historical Provider, MD  metoCLOPramide (REGLAN) 10 MG tablet Take 1 tablet (10 mg total) by mouth every 6 (six) hours as needed (headache or nausea). 04/02/11 04/12/11  Dione Booze, MD    Review of Systems:  As per HPI- otherwise negative.   Physical Examination: Filed Vitals:   06/30/11 0924  BP: 113/75  Pulse: 102  Temp: 99.1 F (37.3 C)  Resp: 16   Filed Vitals:   06/30/11 0924  Height: 5\' 4"  (1.626 m)  Weight: 173 lb (78.472 kg)   Body mass index is 29.70 kg/(m^2).  Recheck pulse: 80 BPM, temp 98.2 GEN: WDWN, NAD, Non-toxic, A & O x 3 HEENT: Atraumatic, Normocephalic. Neck supple. No masses, No LAD. Ears and Nose: No external deformity. CV: RRR, No M/G/R. No JVD. No thrill. No extra heart sounds. PULM: CTA B, no wheezes, crackles, rhonchi. No retractions. No resp. distress. No accessory muscle use. ABD: S, ND.  Sharma Covert BS.  She has some muscular tenderness in her lower abdomen, left greater than right, but nothing suggestive of intraabdominal pathology.  No hip flexor pain EXTR: No c/c/e NEURO Normal gait.  PSYCH: Normally interactive. Conversant. Not depressed or anxious appearing.  Calm demeanor.    Assessment and  Plan: 1. Muscle pain   2. Abdominal muscle strain    Reassured that she likely just over- did it at her martial arts class.  She has no pain except when she tries to do a sit- up or other abdominal exercise.  Rest and stretch abs until soreness is better, then start a program of less intense abdominal exercises such as crunches to strengthen her core prior to returning to such intense abdominal exercises.    Abbe Amsterdam, MD 06/30/2011 10:32 AM

## 2011-11-15 DIAGNOSIS — L658 Other specified nonscarring hair loss: Secondary | ICD-10-CM | POA: Insufficient documentation

## 2011-11-15 DIAGNOSIS — L650 Telogen effluvium: Secondary | ICD-10-CM | POA: Insufficient documentation

## 2011-11-30 ENCOUNTER — Encounter: Payer: PRIVATE HEALTH INSURANCE | Admitting: Family Medicine

## 2011-11-30 NOTE — Progress Notes (Signed)
error    This encounter was created in error - please disregard.

## 2013-02-18 IMAGING — CT CT HEAD W/O CM
2 series · 16 of 30 positions shown, 20 images · non-contrast
Comparison: None.

CLINICAL DATA: Code stroke, tunnel vision.

CT HEAD WITHOUT CONTRAST
TECHNIQUE: Contiguous axial images were obtained from the base of
the skull through the vertex without contrast.

[Series 2: head w/o · axial · non-contrast · 0.49mm/px · z∈[+129,+249]mm · 13 of 29 slices shown, 17 images]
[im 3/29  brain]
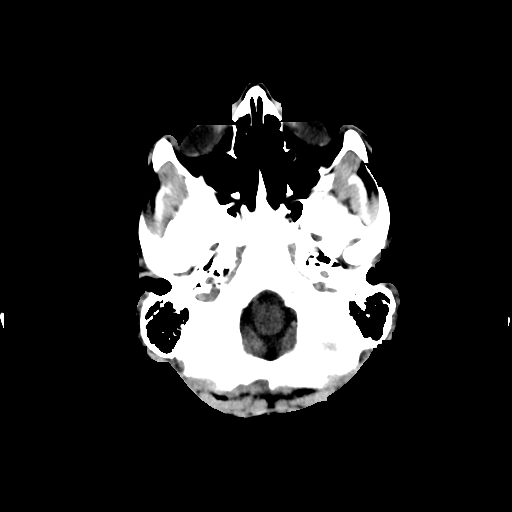
[im 3/29  bone]
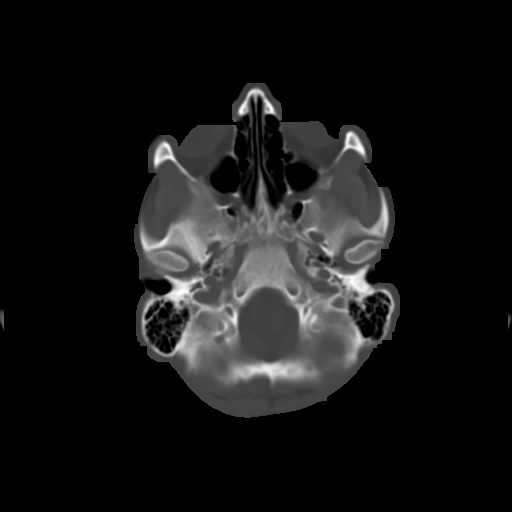
[im 5/29  brain]
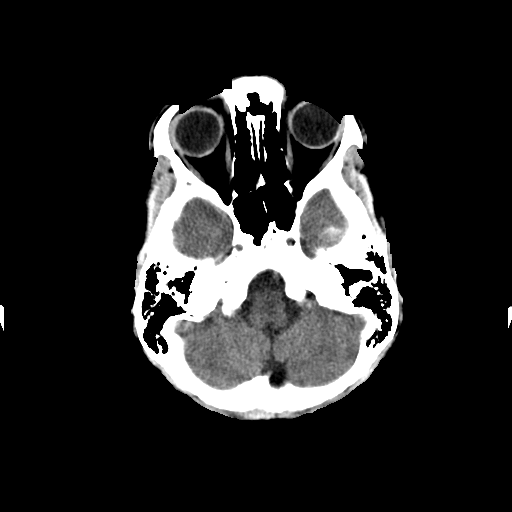
[im 7/29  brain]
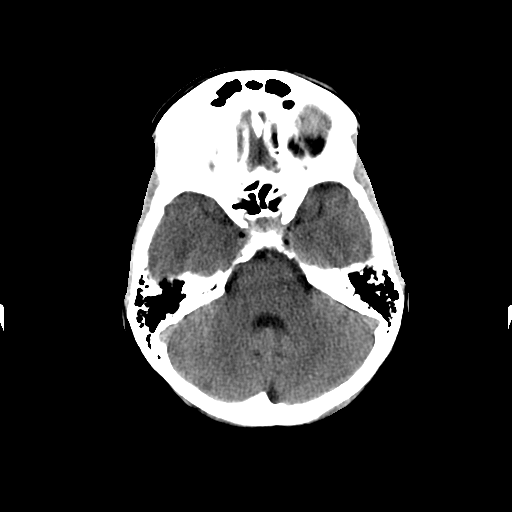
[im 9/29  brain]
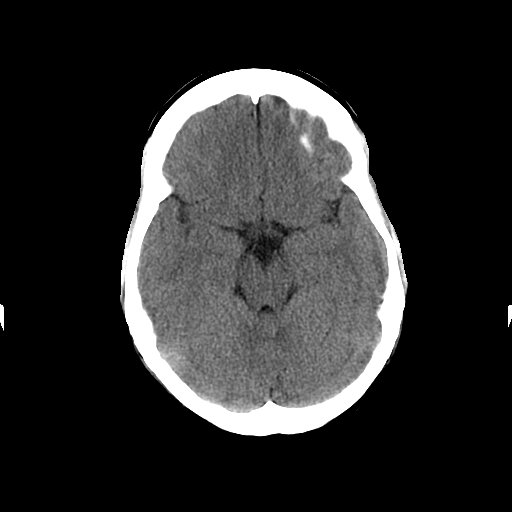
[im 11/29  brain]
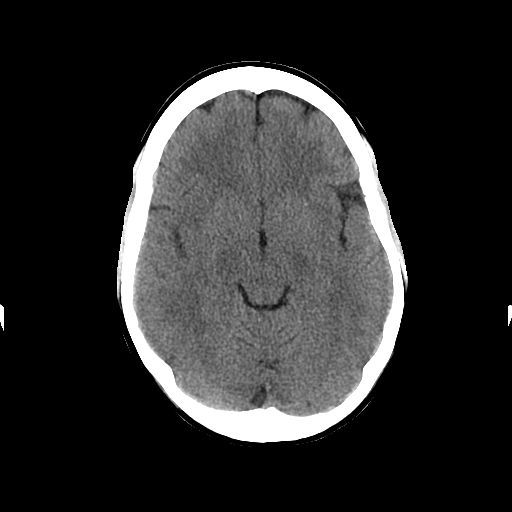
[im 11/29  bone]
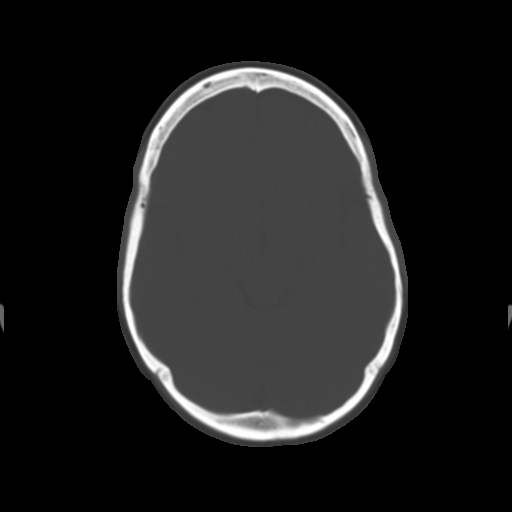
[im 13/29  brain]
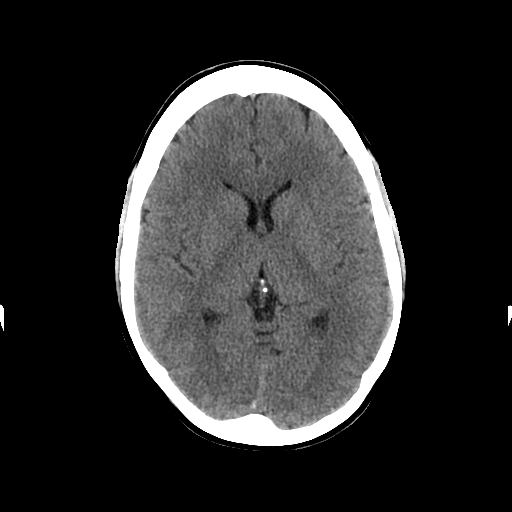
[im 15/29  brain]
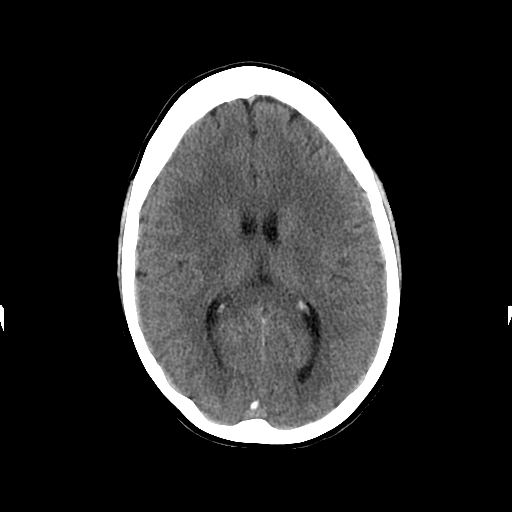
[im 17/29  brain]
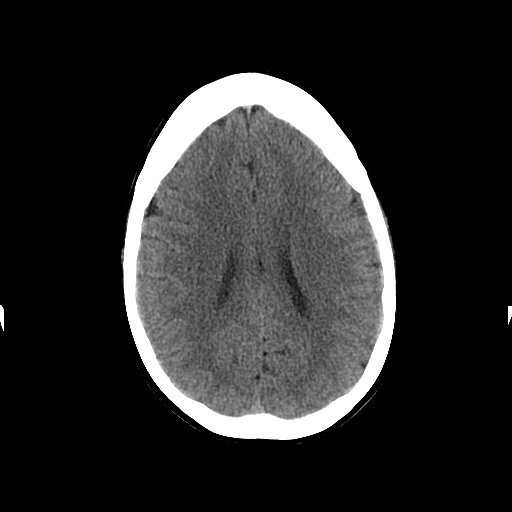
[im 19/29  brain]
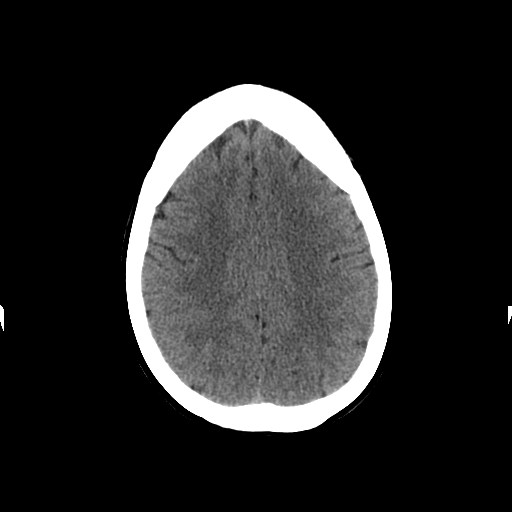
[im 19/29  bone]
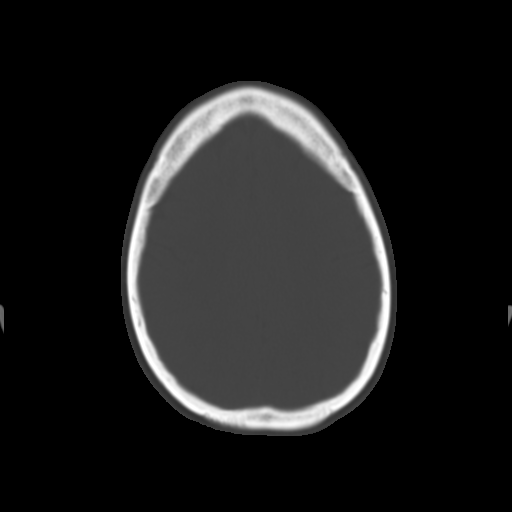
[im 21/29  brain]
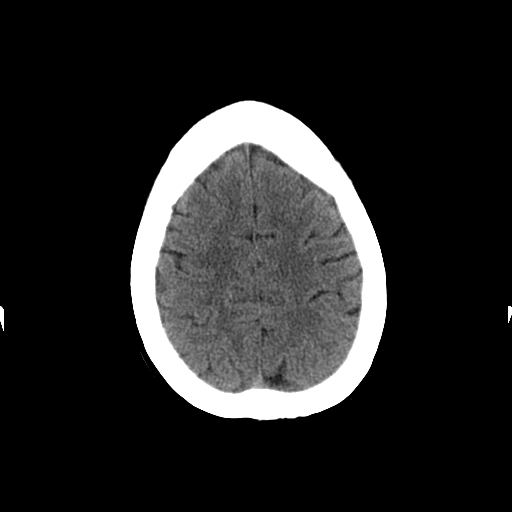
[im 23/29  brain]
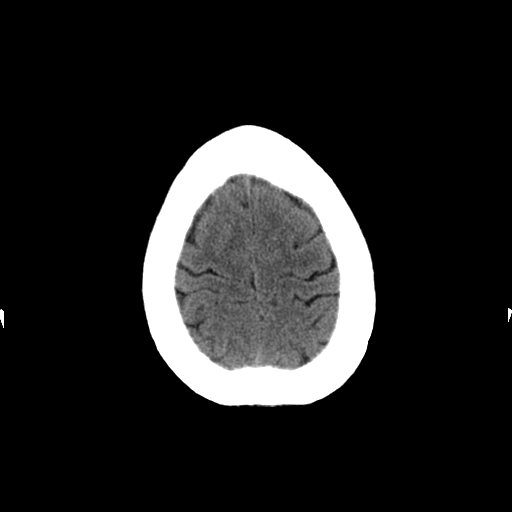
[im 25/29  brain]
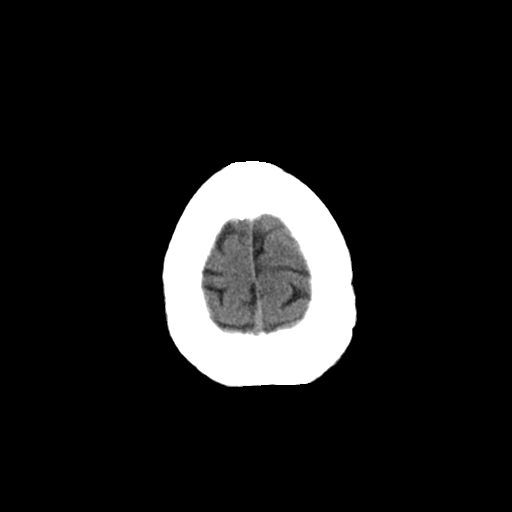
[im 27/29  brain]
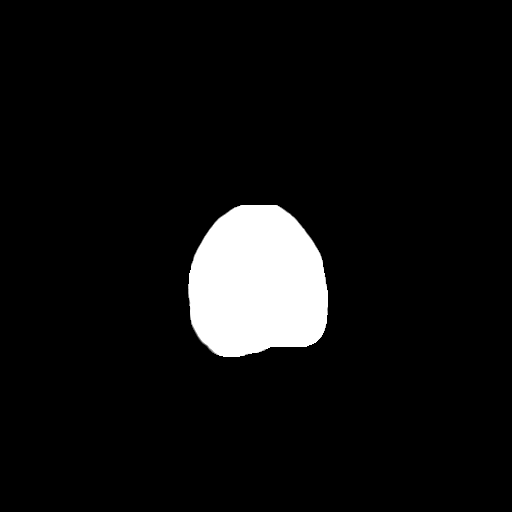
[im 27/29  bone]
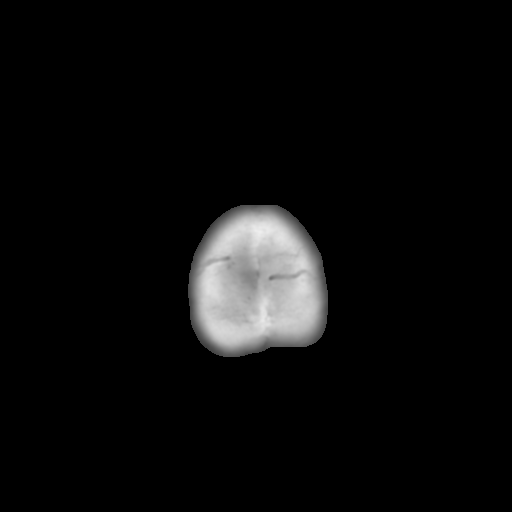

[Series 3: head w/o bone · axial · non-contrast · 0.49mm/px · z∈[+129,+169]mm · 3 of 29 slices shown]
[im 3/29  bone]
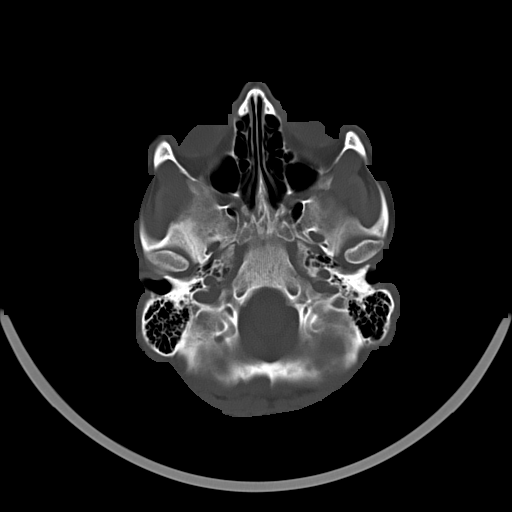
[im 7/29  bone]
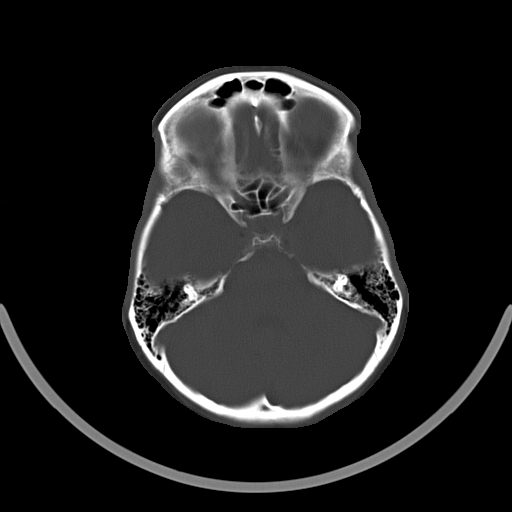
[im 11/29  bone]
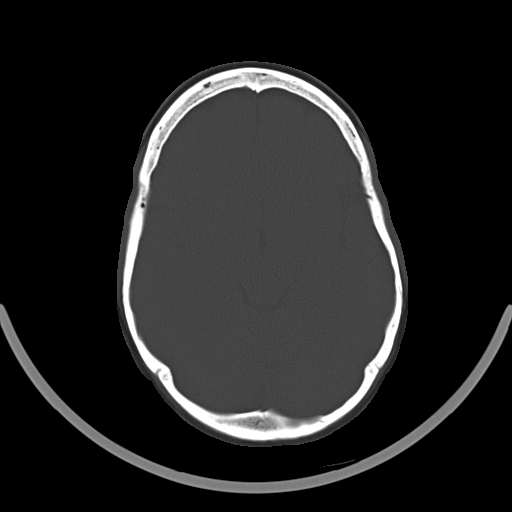

[16 of 30 positions shown; findings below may reference images not displayed]

FINDINGS: No evidence of acute infarct, acute hemorrhage, mass
lesion, mass effect or hydrocephalus.  Visualized portions of the
paranasal sinuses and mastoid air cells are clear.
IMPRESSION: Negative. These results were called by telephone on 04/02/2011  at
1443 hours to  Dr. Tiger, who verbally acknowledged these results.

## 2013-04-27 ENCOUNTER — Other Ambulatory Visit: Payer: Self-pay | Admitting: Internal Medicine

## 2013-04-27 DIAGNOSIS — G35 Multiple sclerosis: Secondary | ICD-10-CM

## 2013-05-08 ENCOUNTER — Ambulatory Visit
Admission: RE | Admit: 2013-05-08 | Discharge: 2013-05-08 | Disposition: A | Discharge status: Home / Self Care | Payer: BC Managed Care – PPO | Source: Ambulatory Visit | Attending: Internal Medicine | Admitting: Internal Medicine

## 2013-05-08 DIAGNOSIS — G35 Multiple sclerosis: Secondary | ICD-10-CM

## 2015-06-21 ENCOUNTER — Other Ambulatory Visit: Payer: Self-pay | Admitting: Family Medicine

## 2015-06-21 ENCOUNTER — Ambulatory Visit
Admission: RE | Admit: 2015-06-21 | Discharge: 2015-06-21 | Disposition: A | Discharge status: Home / Self Care | Payer: No Typology Code available for payment source | Source: Ambulatory Visit | Attending: Family Medicine | Admitting: Family Medicine

## 2015-06-21 DIAGNOSIS — M79661 Pain in right lower leg: Secondary | ICD-10-CM

## 2015-11-29 DIAGNOSIS — E039 Hypothyroidism, unspecified: Secondary | ICD-10-CM | POA: Insufficient documentation

## 2015-11-29 DIAGNOSIS — M791 Myalgia, unspecified site: Secondary | ICD-10-CM | POA: Insufficient documentation

## 2015-11-29 DIAGNOSIS — R7301 Impaired fasting glucose: Secondary | ICD-10-CM | POA: Insufficient documentation

## 2016-02-13 DIAGNOSIS — E039 Hypothyroidism, unspecified: Secondary | ICD-10-CM | POA: Diagnosis not present

## 2016-02-13 DIAGNOSIS — F411 Generalized anxiety disorder: Secondary | ICD-10-CM | POA: Diagnosis not present

## 2016-02-21 DIAGNOSIS — M546 Pain in thoracic spine: Secondary | ICD-10-CM | POA: Diagnosis not present

## 2016-02-21 DIAGNOSIS — M9902 Segmental and somatic dysfunction of thoracic region: Secondary | ICD-10-CM | POA: Diagnosis not present

## 2016-02-23 DIAGNOSIS — M546 Pain in thoracic spine: Secondary | ICD-10-CM | POA: Diagnosis not present

## 2016-02-23 DIAGNOSIS — M9902 Segmental and somatic dysfunction of thoracic region: Secondary | ICD-10-CM | POA: Diagnosis not present

## 2016-02-24 DIAGNOSIS — M546 Pain in thoracic spine: Secondary | ICD-10-CM | POA: Diagnosis not present

## 2016-02-24 DIAGNOSIS — M9902 Segmental and somatic dysfunction of thoracic region: Secondary | ICD-10-CM | POA: Diagnosis not present

## 2016-02-27 DIAGNOSIS — M9902 Segmental and somatic dysfunction of thoracic region: Secondary | ICD-10-CM | POA: Diagnosis not present

## 2016-02-27 DIAGNOSIS — M546 Pain in thoracic spine: Secondary | ICD-10-CM | POA: Diagnosis not present

## 2016-03-01 DIAGNOSIS — M9902 Segmental and somatic dysfunction of thoracic region: Secondary | ICD-10-CM | POA: Diagnosis not present

## 2016-03-01 DIAGNOSIS — M546 Pain in thoracic spine: Secondary | ICD-10-CM | POA: Diagnosis not present

## 2016-03-02 DIAGNOSIS — M9902 Segmental and somatic dysfunction of thoracic region: Secondary | ICD-10-CM | POA: Diagnosis not present

## 2016-03-02 DIAGNOSIS — M546 Pain in thoracic spine: Secondary | ICD-10-CM | POA: Diagnosis not present

## 2016-03-12 DIAGNOSIS — M9902 Segmental and somatic dysfunction of thoracic region: Secondary | ICD-10-CM | POA: Diagnosis not present

## 2016-03-12 DIAGNOSIS — M546 Pain in thoracic spine: Secondary | ICD-10-CM | POA: Diagnosis not present

## 2016-03-15 DIAGNOSIS — M546 Pain in thoracic spine: Secondary | ICD-10-CM | POA: Diagnosis not present

## 2016-03-15 DIAGNOSIS — M9902 Segmental and somatic dysfunction of thoracic region: Secondary | ICD-10-CM | POA: Diagnosis not present

## 2016-03-19 DIAGNOSIS — M546 Pain in thoracic spine: Secondary | ICD-10-CM | POA: Diagnosis not present

## 2016-03-19 DIAGNOSIS — M9902 Segmental and somatic dysfunction of thoracic region: Secondary | ICD-10-CM | POA: Diagnosis not present

## 2016-03-22 DIAGNOSIS — M546 Pain in thoracic spine: Secondary | ICD-10-CM | POA: Diagnosis not present

## 2016-03-22 DIAGNOSIS — M9902 Segmental and somatic dysfunction of thoracic region: Secondary | ICD-10-CM | POA: Diagnosis not present

## 2016-03-26 DIAGNOSIS — M9902 Segmental and somatic dysfunction of thoracic region: Secondary | ICD-10-CM | POA: Diagnosis not present

## 2016-03-26 DIAGNOSIS — M546 Pain in thoracic spine: Secondary | ICD-10-CM | POA: Diagnosis not present

## 2016-03-29 DIAGNOSIS — M9902 Segmental and somatic dysfunction of thoracic region: Secondary | ICD-10-CM | POA: Diagnosis not present

## 2016-03-29 DIAGNOSIS — M546 Pain in thoracic spine: Secondary | ICD-10-CM | POA: Diagnosis not present

## 2016-04-02 DIAGNOSIS — M9902 Segmental and somatic dysfunction of thoracic region: Secondary | ICD-10-CM | POA: Diagnosis not present

## 2016-04-02 DIAGNOSIS — M546 Pain in thoracic spine: Secondary | ICD-10-CM | POA: Diagnosis not present

## 2016-04-05 DIAGNOSIS — M546 Pain in thoracic spine: Secondary | ICD-10-CM | POA: Diagnosis not present

## 2016-04-05 DIAGNOSIS — M9902 Segmental and somatic dysfunction of thoracic region: Secondary | ICD-10-CM | POA: Diagnosis not present

## 2016-04-12 DIAGNOSIS — M9902 Segmental and somatic dysfunction of thoracic region: Secondary | ICD-10-CM | POA: Diagnosis not present

## 2016-04-12 DIAGNOSIS — M546 Pain in thoracic spine: Secondary | ICD-10-CM | POA: Diagnosis not present

## 2016-04-16 DIAGNOSIS — M546 Pain in thoracic spine: Secondary | ICD-10-CM | POA: Diagnosis not present

## 2016-04-16 DIAGNOSIS — M9902 Segmental and somatic dysfunction of thoracic region: Secondary | ICD-10-CM | POA: Diagnosis not present

## 2016-04-23 DIAGNOSIS — M546 Pain in thoracic spine: Secondary | ICD-10-CM | POA: Diagnosis not present

## 2016-04-23 DIAGNOSIS — E039 Hypothyroidism, unspecified: Secondary | ICD-10-CM | POA: Diagnosis not present

## 2016-04-23 DIAGNOSIS — M9902 Segmental and somatic dysfunction of thoracic region: Secondary | ICD-10-CM | POA: Diagnosis not present

## 2016-04-30 DIAGNOSIS — M9902 Segmental and somatic dysfunction of thoracic region: Secondary | ICD-10-CM | POA: Diagnosis not present

## 2016-04-30 DIAGNOSIS — M546 Pain in thoracic spine: Secondary | ICD-10-CM | POA: Diagnosis not present

## 2016-07-16 DIAGNOSIS — E039 Hypothyroidism, unspecified: Secondary | ICD-10-CM | POA: Diagnosis not present

## 2016-07-16 DIAGNOSIS — F411 Generalized anxiety disorder: Secondary | ICD-10-CM | POA: Diagnosis not present

## 2016-07-16 DIAGNOSIS — L700 Acne vulgaris: Secondary | ICD-10-CM | POA: Diagnosis not present

## 2016-08-12 DIAGNOSIS — S238XXA Sprain of other specified parts of thorax, initial encounter: Secondary | ICD-10-CM | POA: Diagnosis not present

## 2016-09-24 DIAGNOSIS — M549 Dorsalgia, unspecified: Secondary | ICD-10-CM | POA: Diagnosis not present

## 2016-09-24 DIAGNOSIS — Z0000 Encounter for general adult medical examination without abnormal findings: Secondary | ICD-10-CM | POA: Diagnosis not present

## 2016-09-24 DIAGNOSIS — Z87898 Personal history of other specified conditions: Secondary | ICD-10-CM | POA: Diagnosis not present

## 2016-09-24 DIAGNOSIS — M7712 Lateral epicondylitis, left elbow: Secondary | ICD-10-CM | POA: Diagnosis not present

## 2016-11-21 DIAGNOSIS — N3001 Acute cystitis with hematuria: Secondary | ICD-10-CM | POA: Insufficient documentation

## 2016-11-21 DIAGNOSIS — R300 Dysuria: Secondary | ICD-10-CM | POA: Diagnosis not present

## 2017-03-12 DIAGNOSIS — J029 Acute pharyngitis, unspecified: Secondary | ICD-10-CM | POA: Diagnosis not present

## 2017-04-08 DIAGNOSIS — E039 Hypothyroidism, unspecified: Secondary | ICD-10-CM | POA: Diagnosis not present

## 2017-06-10 DIAGNOSIS — F322 Major depressive disorder, single episode, severe without psychotic features: Secondary | ICD-10-CM | POA: Diagnosis not present

## 2017-07-08 DIAGNOSIS — F322 Major depressive disorder, single episode, severe without psychotic features: Secondary | ICD-10-CM | POA: Diagnosis not present

## 2017-09-29 DIAGNOSIS — S8392XA Sprain of unspecified site of left knee, initial encounter: Secondary | ICD-10-CM | POA: Diagnosis not present

## 2017-11-25 DIAGNOSIS — N926 Irregular menstruation, unspecified: Secondary | ICD-10-CM | POA: Diagnosis not present

## 2017-11-25 DIAGNOSIS — F418 Other specified anxiety disorders: Secondary | ICD-10-CM | POA: Diagnosis not present

## 2017-11-25 DIAGNOSIS — G43909 Migraine, unspecified, not intractable, without status migrainosus: Secondary | ICD-10-CM | POA: Diagnosis not present

## 2017-11-25 DIAGNOSIS — F322 Major depressive disorder, single episode, severe without psychotic features: Secondary | ICD-10-CM | POA: Diagnosis not present

## 2017-12-27 DIAGNOSIS — M9901 Segmental and somatic dysfunction of cervical region: Secondary | ICD-10-CM | POA: Diagnosis not present

## 2017-12-27 DIAGNOSIS — M9902 Segmental and somatic dysfunction of thoracic region: Secondary | ICD-10-CM | POA: Diagnosis not present

## 2017-12-27 DIAGNOSIS — M5020 Other cervical disc displacement, unspecified cervical region: Secondary | ICD-10-CM | POA: Diagnosis not present

## 2017-12-30 DIAGNOSIS — M9901 Segmental and somatic dysfunction of cervical region: Secondary | ICD-10-CM | POA: Diagnosis not present

## 2017-12-30 DIAGNOSIS — M9902 Segmental and somatic dysfunction of thoracic region: Secondary | ICD-10-CM | POA: Diagnosis not present

## 2017-12-30 DIAGNOSIS — M5020 Other cervical disc displacement, unspecified cervical region: Secondary | ICD-10-CM | POA: Diagnosis not present

## 2018-02-24 ENCOUNTER — Other Ambulatory Visit: Payer: Self-pay | Admitting: Family Medicine

## 2018-02-24 ENCOUNTER — Other Ambulatory Visit (HOSPITAL_COMMUNITY)
Admission: RE | Admit: 2018-02-24 | Discharge: 2018-02-24 | Disposition: A | Discharge status: Home / Self Care | Payer: BLUE CROSS/BLUE SHIELD | Source: Ambulatory Visit | Attending: Family Medicine | Admitting: Family Medicine

## 2018-02-24 DIAGNOSIS — Z124 Encounter for screening for malignant neoplasm of cervix: Secondary | ICD-10-CM | POA: Insufficient documentation

## 2018-02-24 DIAGNOSIS — Z0000 Encounter for general adult medical examination without abnormal findings: Secondary | ICD-10-CM | POA: Diagnosis not present

## 2018-02-26 LAB — CYTOLOGY - PAP
ADEQUACY: ABSENT
Diagnosis: NEGATIVE
HPV: NOT DETECTED

## 2018-03-03 DIAGNOSIS — F4310 Post-traumatic stress disorder, unspecified: Secondary | ICD-10-CM | POA: Diagnosis not present

## 2018-03-10 DIAGNOSIS — Z13220 Encounter for screening for lipoid disorders: Secondary | ICD-10-CM | POA: Diagnosis not present

## 2018-03-10 DIAGNOSIS — E039 Hypothyroidism, unspecified: Secondary | ICD-10-CM | POA: Diagnosis not present

## 2018-03-17 DIAGNOSIS — F419 Anxiety disorder, unspecified: Secondary | ICD-10-CM | POA: Diagnosis not present

## 2018-03-17 DIAGNOSIS — R41840 Attention and concentration deficit: Secondary | ICD-10-CM | POA: Diagnosis not present

## 2018-03-17 DIAGNOSIS — Z79899 Other long term (current) drug therapy: Secondary | ICD-10-CM | POA: Diagnosis not present

## 2018-03-17 DIAGNOSIS — F902 Attention-deficit hyperactivity disorder, combined type: Secondary | ICD-10-CM | POA: Diagnosis not present

## 2018-03-24 DIAGNOSIS — F4310 Post-traumatic stress disorder, unspecified: Secondary | ICD-10-CM | POA: Diagnosis not present

## 2018-03-31 DIAGNOSIS — F4310 Post-traumatic stress disorder, unspecified: Secondary | ICD-10-CM | POA: Diagnosis not present

## 2018-04-14 DIAGNOSIS — F902 Attention-deficit hyperactivity disorder, combined type: Secondary | ICD-10-CM | POA: Diagnosis not present

## 2018-04-14 DIAGNOSIS — Z79899 Other long term (current) drug therapy: Secondary | ICD-10-CM | POA: Diagnosis not present

## 2018-04-28 DIAGNOSIS — M62830 Muscle spasm of back: Secondary | ICD-10-CM | POA: Diagnosis not present

## 2018-05-19 DIAGNOSIS — E039 Hypothyroidism, unspecified: Secondary | ICD-10-CM | POA: Diagnosis not present

## 2018-06-11 DIAGNOSIS — I471 Supraventricular tachycardia: Secondary | ICD-10-CM | POA: Diagnosis not present

## 2018-06-18 ENCOUNTER — Telehealth: Payer: Self-pay | Admitting: *Deleted

## 2018-06-18 NOTE — Telephone Encounter (Signed)
REFERRAL SENT TO SCHEDULING AND NOTES ON FILE FROM [facility] DR. Beverley Fiedler [phone].

## 2018-06-30 ENCOUNTER — Encounter: Payer: Self-pay | Admitting: Cardiology

## 2018-06-30 ENCOUNTER — Telehealth: Payer: Self-pay

## 2018-06-30 ENCOUNTER — Other Ambulatory Visit: Payer: Self-pay

## 2018-06-30 ENCOUNTER — Telehealth (INDEPENDENT_AMBULATORY_CARE_PROVIDER_SITE_OTHER): Payer: BLUE CROSS/BLUE SHIELD | Admitting: Cardiology

## 2018-06-30 VITALS — BP 113/77 | HR 95 | Ht 64.0 in | Wt 168.0 lb

## 2018-06-30 DIAGNOSIS — R002 Palpitations: Secondary | ICD-10-CM | POA: Insufficient documentation

## 2018-06-30 DIAGNOSIS — R0609 Other forms of dyspnea: Secondary | ICD-10-CM | POA: Diagnosis not present

## 2018-06-30 DIAGNOSIS — R000 Tachycardia, unspecified: Secondary | ICD-10-CM | POA: Insufficient documentation

## 2018-06-30 DIAGNOSIS — I472 Ventricular tachycardia, unspecified: Secondary | ICD-10-CM

## 2018-06-30 DIAGNOSIS — R011 Cardiac murmur, unspecified: Secondary | ICD-10-CM

## 2018-06-30 NOTE — Progress Notes (Signed)
Virtual Visit via Video Note   This visit type was conducted due to national recommendations for restrictions regarding the COVID-19 Pandemic (e.g. social distancing) in an effort to limit this patient's exposure and mitigate transmission in our community.  Due to her co-morbid illnesses, this patient is at least at moderate risk for complications without adequate follow up.  This format is felt to be most appropriate for this patient at this time.  All issues noted in this document were discussed and addressed.  A limited physical exam was performed with this format.  Please refer to the patient's chart for her consent to telehealth for [facility].   Date:  06/30/2018   ID:  Crystal Parks, DOB 08-01-1975, MRN [id number]  Patient Location: Home Provider Location: Home  PCP:  Terressa Koyanagi, DO  Cardiologist:  No primary care provider on file.  ` Electrophysiologist:  None   Evaluation Performed:  Consultation - Daine Gip was referred by Dr Luciana Axe for the evaluation of palpitations.  Chief Complaint: Palpitations and tachycardia and dyspnea on exertion.  History of Present Illness:    Crystal Parks is a 43 y.o. female with past medical history that is not very significant.  Patient mentions to me that she feels her heart rate is elevated.  When she wakes up in the morning her heart rates in the 90s.  When she exercises it goes to about 160 or 170.  She recently had blood work and I do not have a copy of it yet but I am awaiting it.  EKG and blood work was unremarkable according with the patient.  No chest pain orthopnea or PND.  No dizziness or any syncopal spells.  At the time of my evaluation, the patient is alert awake oriented and in no distress.  The patient does not have symptoms concerning for COVID-19 infection (fever, chills, cough, or new shortness of breath).    Past Medical History:  Diagnosis Date  . ADHD (attention deficit hyperactivity disorder)   . Thyroid  disease    Past Surgical History:  Procedure Laterality Date  . BREAST SURGERY    . ceas     c-section     Current Meds  Medication Sig  . ALPRAZolam (XANAX) 0.5 MG tablet Take 1 tablet by mouth as needed.  . magnesium hydroxide (MILK OF MAGNESIA) 800 MG/5ML suspension Take by mouth daily as needed for constipation.  Marland Kitchen thyroid (ARMOUR) 60 MG tablet Take 1 tablet by mouth daily.  Marland Kitchen zinc gluconate 50 MG tablet Take 50 mg by mouth daily.  . [DISCONTINUED] levothyroxine (SYNTHROID, LEVOTHROID) 88 MCG tablet Take 88 mcg by mouth daily.     Allergies:   Ciprofloxacin   Social History   Tobacco Use  . Smoking status: Former Games developer  . Smokeless tobacco: Never Used  Substance Use Topics  . Alcohol use: Yes    Comment: occ  . Drug use: No     Family Hx: The patient's family history is not on file.  ROS:   Please see the history of present illness.    As mentioned above All other systems reviewed and are negative.   Prior CV studies:   The following studies were reviewed today:  EKG and blood work from primary care physician's office was reviewed  Labs/Other Tests and Data Reviewed:    EKG:  EKG done at primary care doctor's office was reviewed  Recent Labs: No results found for requested labs within last 8760  hours.   Recent Lipid Panel No results found for: CHOL, TRIG, HDL, CHOLHDL, LDLCALC, LDLDIRECT  Wt Readings from Last 3 Encounters:  06/30/18 168 lb (76.2 kg)  06/30/11 173 lb (78.5 kg)     Objective:    Vital Signs:  BP 113/77 (BP Location: Left Arm, Patient Position: Sitting, Cuff Size: Normal)   Pulse 95   Ht 5\' 4"  (1.626 m)   Wt 168 lb (76.2 kg)   BMI 28.84 kg/m    VITAL SIGNS:  reviewed  ASSESSMENT & PLAN:    1. Inappropriate tachycardia: We will obtain blood work reports including thyroid testing and evaluated.  I have also going to put her 2-week ZIO monitor to assess her tachycardia.  She is going to note on all her events including her  exercise so I can correlate with her heart rates. 2. Echocardiogram will be done to assess this patient with history of cardiac murmur.  3. Patient will undergo Lexiscan sestamibi to assess dyspnea on exertion. 4. I will evaluate her records including EKG and lab work and then give her a follow-up based on that evaluation. 5. Patient will be seen in follow-up appointment in 2 months or earlier if the patient has any concerns   COVID-19 Education: The signs and symptoms of COVID-19 were discussed with the patient and how to seek care for testing (follow up with PCP or arrange E-visit).  The importance of social distancing was discussed today.  Time:   Today, I have spent 45 minutes with the patient with telehealth technology discussing the above problems.     Medication Adjustments/Labs and Tests Ordered: Current medicines are reviewed at length with the patient today.  Concerns regarding medicines are outlined above.   Tests Ordered: No orders of the defined types were placed in this encounter.   Medication Changes: No orders of the defined types were placed in this encounter.   Disposition:  Follow up in 2 month(s)  Signed, Garwin Brothers, MD  06/30/2018 3:01 PM     [facility]

## 2018-06-30 NOTE — Telephone Encounter (Signed)
Referral note states Dr.Stephenson. RN called Baird Lyons and was informed that there is no EKG, labs or information related to tachycardia available. Referring person was C.Durel Salts [facility] [place]), interoffice message sent for records request.

## 2018-06-30 NOTE — Patient Instructions (Addendum)
Medication Instructions:  Your physician recommends that you continue on your current medications as directed. Please refer to the Current Medication list given to you today.  If you need a refill on your cardiac medications before your next appointment, please call your pharmacy.   Lab work: NONE If you have labs (blood work) drawn today and your tests are completely normal, you will receive your results only by:  MyChart Message (if you have MyChart) OR  A paper copy in the mail If you have any lab test that is abnormal or we need to change your treatment, we will call you to review the results.  Testing/Procedures: Your physician has requested that you have an echocardiogram.Echocardiography is a painless test that uses sound waves to create images of your heart. It provides your doctor with information about the size and shape of your heart and how well your hearts chambers and valves are working. This procedure takes approximately one hour. There are no restrictions for this procedure.  Your physician has requested that you have a lexiscan myoview. For further information please visit [email or web address] Please follow instruction sheet, as given.   [facility] at [facility] [address], [address] [place], [place] [address] Phone: [phone]  June 30, 2018    Crystal Parks DOB: Feb 07, 1975 MRN: [id number] [address] [place] [address]   Dear Crystal Parks,  You are scheduled for a Myocardial Perfusion Imaging Study on:  Marland Kitchen  Please arrive 15 minutes prior to your appointment time for registration and insurance purposes.  The test will take approximately 3 to 4 hours to complete; you may bring reading material.  If someone comes with you to your appointment, they will need to remain in the main lobby due to limited space in the testing area. **If you are pregnant or breastfeeding, please notify the nuclear lab prior to your  appointment**  How to prepare for your Myocardial Perfusion Test:  Do not eat or drink 3 hours prior to your test, except you may have water.  Do not consume products containing caffeine (regular or decaffeinated) 12 hours prior to your test. (ex: coffee, chocolate, sodas, tea).  Do bring a list of your current medications with you.  If not listed below, you may take your medications as normal.  Do wear comfortable clothes (no dresses or overalls) and walking shoes, tennis shoes preferred (No heels or open toe shoes are allowed).  Do NOT wear cologne, perfume, aftershave, or lotions (deodorant is allowed).  If these instructions are not followed, your test will have to be rescheduled.  Please report to [address], [address] for your test.  If you have questions or concerns about your appointment, you can call the Nuclear Lab at [phone].  If you cannot keep your appointment, please provide 24 hours notification to the Nuclear Lab, to avoid a possible $50 charge to your account.   Follow-Up: At [facility], you and your health needs are our priority.  As part of our continuing mission to provide you with exceptional heart care, we have created designated Provider Care Teams.  These Care Teams include your primary Cardiologist (physician) and Advanced Practice Providers (APPs -  Physician Assistants and Nurse Practitioners) who all work together to provide you with the care you need, when you need it. You will need a follow up appointment in 2 months.    Any Other Special Instructions Will Be Listed Below Regadenoson injection What is this medicine?  REGADENOSON is used to test the heart for coronary artery disease. It is used in patients who can not exercise for their stress test. This medicine may be used for other purposes; ask your health care provider or pharmacist if you have questions. COMMON BRAND NAME(S): Lexiscan What should I tell my health care provider before  I take this medicine? They need to know if you have any of these conditions: -heart problems -lung or breathing disease, like asthma or COPD -an unusual or allergic reaction to regadenoson, other medicines, foods, dyes, or preservatives -pregnant or trying to get pregnant -breast-feeding How should I use this medicine? This medicine is for injection into a vein. It is given by a health care professional in a hospital or clinic setting. Talk to your pediatrician regarding the use of this medicine in children. Special care may be needed. Overdosage: If you think you have taken too much of this medicine contact a poison control center or emergency room at once. NOTE: This medicine is only for you. Do not share this medicine with others. What if I miss a dose? This does not apply. What may interact with this medicine? -caffeine -dipyridamole -guarana -theophylline This list may not describe all possible interactions. Give your health care provider a list of all the medicines, herbs, non-prescription drugs, or dietary supplements you use. Also tell them if you smoke, drink alcohol, or use illegal drugs. Some items may interact with your medicine. What should I watch for while using this medicine? Your condition will be monitored carefully while you are receiving this medicine. Do not take medicines, foods, or drinks with caffeine (like coffee, tea, or colas) for at least 12 hours before your test. If you do not know if something contains caffeine, ask your health care professional. What side effects may I notice from receiving this medicine? Side effects that you should report to your doctor or health care professional as soon as possible: -allergic reactions like skin rash, itching or hives, swelling of the face, lips, or tongue -breathing problems -chest pain, tightness or palpitations -severe headache Side effects that usually do not require medical attention (report to your doctor or  health care professional if they continue or are bothersome): -flushing -headache -irritation or pain at site where injected -nausea, vomiting This list may not describe all possible side effects. Call your doctor for medical advice about side effects. You may report side effects to FDA at 1-800-FDA-1088. Where should I keep my medicine? This drug is given in a hospital or clinic and will not be stored at home. NOTE: This sheet is a summary. It may not cover all possible information. If you have questions about this medicine, talk to your doctor, pharmacist, or health care provider.  2019 Elsevier/Gold Standard (2007-09-15 15:08:13)   Cardiac Nuclear Scan A cardiac nuclear scan is a test that is done to check the flow of blood to your heart. It is done when you are resting and when you are exercising. The test looks for problems such as:  Not enough blood reaching a portion of the heart.  The heart muscle not working as it should. You may need this test if:  You have heart disease.  You have had lab results that are not normal.  You have had heart surgery or a balloon procedure to open up blocked arteries (angioplasty).  You have chest pain.  You have shortness of breath. In this test, a special dye (tracer) is put into your bloodstream. The tracer  will travel to your heart. A camera will then take pictures of your heart to see how the tracer moves through your heart. This test is usually done at a hospital and takes 2-4 hours. Tell a doctor about:  Any allergies you have.  All medicines you are taking, including vitamins, herbs, eye drops, creams, and over-the-counter medicines.  Any problems you or family members have had with anesthetic medicines.  Any blood disorders you have.  Any surgeries you have had.  Any medical conditions you have.  Whether you are pregnant or may be pregnant. What are the risks? Generally, this is a safe test. However, problems may occur,  such as:  Serious chest pain and heart attack. This is only a risk if the stress portion of the test is done.  Rapid heartbeat.  A feeling of warmth in your chest. This feeling usually does not last long.  Allergic reaction to the tracer. What happens before the test?  Ask your doctor about changing or stopping your normal medicines. This is important.  Follow instructions from your doctor about what you cannot eat or drink.  Remove your jewelry on the day of the test. What happens during the test?  An IV tube will be inserted into one of your veins.  Your doctor will give you a small amount of tracer through the IV tube.  You will wait for 20-40 minutes while the tracer moves through your bloodstream.  Your heart will be monitored with an electrocardiogram (ECG).  You will lie down on an exam table.  Pictures of your heart will be taken for about 15-20 minutes.  You may also have a stress test. For this test, one of these things may be done: ? You will be asked to exercise on a treadmill or a stationary bike. ? You will be given medicines that will make your heart work harder. This is done if you are unable to exercise.  When blood flow to your heart has peaked, a tracer will again be given through the IV tube.  After 20-40 minutes, you will get back on the exam table. More pictures will be taken of your heart.  Depending on the tracer that is used, more pictures may need to be taken 3-4 hours later.  Your IV tube will be removed when the test is over. The test may vary among doctors and hospitals. What happens after the test?  Ask your doctor: ? Whether you can return to your normal schedule, including diet, activities, and medicines. ? Whether you should drink more fluids. This will help to remove the tracer from your body. Drink enough fluid to keep your pee (urine) pale yellow.  Ask your doctor, or the department that is doing the test: ? When will my results be  ready? ? How will I get my results? Summary  A cardiac nuclear scan is a test that is done to check the flow of blood to your heart.  Tell your doctor whether you are pregnant or may be pregnant.  Before the test, ask your doctor about changing or stopping your normal medicines. This is important.  Ask your doctor whether you can return to your normal activities. You may be asked to drink more fluids. This information is not intended to replace advice given to you by your health care provider. Make sure you discuss any questions you have with your health care provider. Document Released: 07/01/2017 Document Revised: 07/01/2017 Document Reviewed: 07/01/2017 Elsevier Interactive Patient Education  2019 Elsevier Inc.    Echocardiogram An echocardiogram is a procedure that uses painless sound waves (ultrasound) to produce an image of the heart. Images from an echocardiogram can provide important information about:  Signs of coronary artery disease (CAD).  Aneurysm detection. An aneurysm is a weak or damaged part of an artery wall that bulges out from the normal force of blood pumping through the body.  Heart size and shape. Changes in the size or shape of the heart can be associated with certain conditions, including heart failure, aneurysm, and CAD.  Heart muscle function.  Heart valve function.  Signs of a past heart attack.  Fluid buildup around the heart.  Thickening of the heart muscle.  A tumor or infectious growth around the heart valves. Tell a health care provider about:  Any allergies you have.  All medicines you are taking, including vitamins, herbs, eye drops, creams, and over-the-counter medicines.  Any blood disorders you have.  Any surgeries you have had.  Any medical conditions you have.  Whether you are pregnant or may be pregnant. What are the risks? Generally, this is a safe procedure. However, problems may occur, including:  Allergic reaction to  dye (contrast) that may be used during the procedure. What happens before the procedure? No specific preparation is needed. You may eat and drink normally. What happens during the procedure?   An IV tube may be inserted into one of your veins.  You may receive contrast through this tube. A contrast is an injection that improves the quality of the pictures from your heart.  A gel will be applied to your chest.  A wand-like tool (transducer) will be moved over your chest. The gel will help to transmit the sound waves from the transducer.  The sound waves will harmlessly bounce off of your heart to allow the heart images to be captured in real-time motion. The images will be recorded on a computer. The procedure may vary among health care providers and hospitals. What happens after the procedure?  You may return to your normal, everyday life, including diet, activities, and medicines, unless your health care provider tells you not to do that. Summary  An echocardiogram is a procedure that uses painless sound waves (ultrasound) to produce an image of the heart.  Images from an echocardiogram can provide important information about the size and shape of your heart, heart muscle function, heart valve function, and fluid buildup around your heart.  You do not need to do anything to prepare before this procedure. You may eat and drink normally.  After the echocardiogram is completed, you may return to your normal, everyday life, unless your health care provider tells you not to do that. This information is not intended to replace advice given to you by your health care provider. Make sure you discuss any questions you have with your health care provider. Document Released: 01/13/2000 Document Revised: 02/18/2016 Document Reviewed: 02/18/2016 Elsevier Interactive Patient Education  2019 [employer].

## 2018-07-07 DIAGNOSIS — Q141 Congenital malformation of retina: Secondary | ICD-10-CM | POA: Diagnosis not present

## 2018-07-07 DIAGNOSIS — H04123 Dry eye syndrome of bilateral lacrimal glands: Secondary | ICD-10-CM | POA: Diagnosis not present

## 2018-07-07 DIAGNOSIS — H524 Presbyopia: Secondary | ICD-10-CM | POA: Diagnosis not present

## 2018-07-14 ENCOUNTER — Other Ambulatory Visit: Payer: Self-pay | Admitting: Family Medicine

## 2018-07-14 DIAGNOSIS — Z1231 Encounter for screening mammogram for malignant neoplasm of breast: Secondary | ICD-10-CM

## 2018-07-17 ENCOUNTER — Telehealth (HOSPITAL_COMMUNITY): Payer: Self-pay | Admitting: *Deleted

## 2018-07-17 DIAGNOSIS — R1011 Right upper quadrant pain: Secondary | ICD-10-CM | POA: Diagnosis not present

## 2018-07-17 DIAGNOSIS — R197 Diarrhea, unspecified: Secondary | ICD-10-CM | POA: Diagnosis not present

## 2018-07-17 NOTE — Telephone Encounter (Signed)
Left message on email per DPR in reference to upcoming appointment scheduled on 07/21/18 at 10:00 with detailed instructions given per Myocardial Perfusion Study Information Sheet for the test. LM to arrive 15 minutes early, and that it is imperative to arrive on time for appointment to keep from having the test rescheduled. If you need to cancel or reschedule your appointment, please call the office within 24 hours of your appointment. Failure to do so may result in a cancellation of your appointment, and a $50 no show fee. Phone number given for call back for any questions.

## 2018-07-18 ENCOUNTER — Telehealth (HOSPITAL_COMMUNITY): Payer: Self-pay | Admitting: Radiology

## 2018-07-18 NOTE — Telephone Encounter (Signed)
Called to give echo instructions-unable to leave message due to [facility].

## 2018-07-21 ENCOUNTER — Ambulatory Visit (HOSPITAL_COMMUNITY): Payer: BC Managed Care – PPO | Attending: Cardiovascular Disease

## 2018-07-21 ENCOUNTER — Other Ambulatory Visit (HOSPITAL_COMMUNITY): Payer: Self-pay | Admitting: Cardiology

## 2018-07-21 ENCOUNTER — Ambulatory Visit (HOSPITAL_BASED_OUTPATIENT_CLINIC_OR_DEPARTMENT_OTHER): Payer: BC Managed Care – PPO

## 2018-07-21 ENCOUNTER — Encounter (HOSPITAL_COMMUNITY): Payer: No Typology Code available for payment source

## 2018-07-21 ENCOUNTER — Encounter (HOSPITAL_COMMUNITY): Payer: Self-pay

## 2018-07-21 ENCOUNTER — Other Ambulatory Visit: Payer: Self-pay

## 2018-07-21 DIAGNOSIS — R0609 Other forms of dyspnea: Secondary | ICD-10-CM | POA: Insufficient documentation

## 2018-07-21 DIAGNOSIS — R011 Cardiac murmur, unspecified: Secondary | ICD-10-CM | POA: Insufficient documentation

## 2018-07-21 LAB — MYOCARDIAL PERFUSION IMAGING
LV dias vol: 55 mL (ref 46–106)
LV sys vol: 16 mL
Peak HR: 116 {beats}/min
Rest HR: 64 {beats}/min
SDS: 5
SRS: 2
SSS: 7
TID: 0.76

## 2018-07-21 MED ORDER — TECHNETIUM TC 99M TETROFOSMIN IV KIT
32.6000 | PACK | Freq: Once | INTRAVENOUS | Status: AC | PRN
  Administered 2018-07-21: 32.6 via INTRAVENOUS
  Filled 2018-07-21: qty 33

## 2018-07-21 MED ORDER — REGADENOSON 0.4 MG/5ML IV SOLN
0.4000 mg | Freq: Once | INTRAVENOUS | Status: AC
  Administered 2018-07-21: 0.4 mg via INTRAVENOUS

## 2018-07-21 MED ORDER — TECHNETIUM TC 99M TETROFOSMIN IV KIT
11.0000 | PACK | Freq: Once | INTRAVENOUS | Status: AC | PRN
  Administered 2018-07-21: 11 via INTRAVENOUS
  Filled 2018-07-21: qty 11

## 2018-07-22 ENCOUNTER — Telehealth: Payer: Self-pay

## 2018-07-22 ENCOUNTER — Telehealth: Payer: Self-pay | Admitting: Cardiology

## 2018-07-22 NOTE — Telephone Encounter (Signed)
Information relayed to patient with no further questions. Copy sent to Dr. Rankins per Dr. RRR request.  

## 2018-07-22 NOTE — Telephone Encounter (Signed)
Patient has more questions about results and she is read test and she states that calcification and all that and hpw she stops from happening. Leave a message if she doesn't answer.

## 2018-07-22 NOTE — Telephone Encounter (Signed)
Information relayed to patient with no further questions. Copy sent to Dr. Radene Ou per Dr. Docia Furl request.

## 2018-07-22 NOTE — Telephone Encounter (Signed)
-----   Message from Jenean Lindau, MD sent at 07/22/2018  9:03 AM EDT ----- The results of the study is unremarkable. Please inform patient. I will discuss in detail at next appointment. Cc  primary care/referring physician Jenean Lindau, MD 07/22/2018 9:03 AM

## 2018-07-22 NOTE — Telephone Encounter (Signed)
-----   Message from Jenean Lindau, MD sent at 07/21/2018 10:11 AM EDT ----- The results of the study is unremarkable. Please inform patient. I will discuss in detail at next appointment. Cc  primary care/referring physician Jenean Lindau, MD 07/21/2018 10:11 AM

## 2018-07-23 NOTE — Addendum Note (Signed)
Addended by: Beckey Rutter on: 07/23/2018 10:28 AM   Modules accepted: Orders

## 2018-07-25 ENCOUNTER — Other Ambulatory Visit: Payer: No Typology Code available for payment source

## 2018-08-05 DIAGNOSIS — R197 Diarrhea, unspecified: Secondary | ICD-10-CM | POA: Diagnosis not present

## 2018-08-05 DIAGNOSIS — K621 Rectal polyp: Secondary | ICD-10-CM | POA: Diagnosis not present

## 2018-08-05 DIAGNOSIS — K640 First degree hemorrhoids: Secondary | ICD-10-CM | POA: Diagnosis not present

## 2018-08-05 DIAGNOSIS — Z8371 Family history of colonic polyps: Secondary | ICD-10-CM | POA: Diagnosis not present

## 2018-08-05 DIAGNOSIS — R1011 Right upper quadrant pain: Secondary | ICD-10-CM | POA: Diagnosis not present

## 2018-08-06 ENCOUNTER — Other Ambulatory Visit: Payer: Self-pay

## 2018-08-06 ENCOUNTER — Ambulatory Visit
Admission: RE | Admit: 2018-08-06 | Discharge: 2018-08-06 | Disposition: A | Discharge status: Home / Self Care | Payer: No Typology Code available for payment source | Source: Ambulatory Visit | Attending: Family Medicine | Admitting: Family Medicine

## 2018-08-06 DIAGNOSIS — Z1231 Encounter for screening mammogram for malignant neoplasm of breast: Secondary | ICD-10-CM

## 2018-08-18 ENCOUNTER — Other Ambulatory Visit: Payer: Self-pay

## 2018-08-18 ENCOUNTER — Ambulatory Visit (INDEPENDENT_AMBULATORY_CARE_PROVIDER_SITE_OTHER)
Admission: RE | Admit: 2018-08-18 | Discharge: 2018-08-18 | Disposition: A | Discharge status: Home / Self Care | Payer: Self-pay | Source: Ambulatory Visit | Attending: Cardiology | Admitting: Cardiology

## 2018-08-18 DIAGNOSIS — R002 Palpitations: Secondary | ICD-10-CM

## 2018-08-18 DIAGNOSIS — R011 Cardiac murmur, unspecified: Secondary | ICD-10-CM

## 2018-08-18 DIAGNOSIS — R000 Tachycardia, unspecified: Secondary | ICD-10-CM

## 2018-08-18 DIAGNOSIS — R0609 Other forms of dyspnea: Secondary | ICD-10-CM

## 2018-08-19 ENCOUNTER — Telehealth: Payer: Self-pay

## 2018-08-19 NOTE — Telephone Encounter (Signed)
-----   Message from Jenean Lindau, MD sent at 08/18/2018 11:45 AM EDT ----- Doristine Devoid results!!! Please inform patient. I will discuss in detail at next appointment. Cc  primary care/referring physician Jenean Lindau, MD 08/18/2018 11:44 AM

## 2018-08-19 NOTE — Telephone Encounter (Signed)
Information relayed to patient, no further questions. Copy sent to Dr. Radene Ou per Dr. Docia Furl request.

## 2018-09-01 ENCOUNTER — Other Ambulatory Visit: Payer: Self-pay

## 2018-09-01 ENCOUNTER — Telehealth: Payer: Self-pay

## 2018-09-01 ENCOUNTER — Telehealth (INDEPENDENT_AMBULATORY_CARE_PROVIDER_SITE_OTHER): Payer: BC Managed Care – PPO | Admitting: Cardiology

## 2018-09-01 ENCOUNTER — Encounter: Payer: Self-pay | Admitting: Cardiology

## 2018-09-01 VITALS — HR 94 | Ht 64.0 in | Wt 165.0 lb

## 2018-09-01 DIAGNOSIS — R000 Tachycardia, unspecified: Secondary | ICD-10-CM | POA: Diagnosis not present

## 2018-09-01 DIAGNOSIS — E039 Hypothyroidism, unspecified: Secondary | ICD-10-CM | POA: Diagnosis not present

## 2018-09-01 DIAGNOSIS — R002 Palpitations: Secondary | ICD-10-CM

## 2018-09-01 NOTE — Progress Notes (Signed)
Virtual Visit via Video Note   This visit type was conducted due to national recommendations for restrictions regarding the COVID-19 Pandemic (e.g. social distancing) in an effort to limit this patient's exposure and mitigate transmission in our community.  Due to her co-morbid illnesses, this patient is at least at moderate risk for complications without adequate follow up.  This format is felt to be most appropriate for this patient at this time.  All issues noted in this document were discussed and addressed.  A limited physical exam was performed with this format.  Please refer to the patient's chart for her consent to telehealth for [facility].   Date:  09/01/2018   ID:  Crystal Parks, DOB 20-Jul-1975, MRN [id number]  Patient Location: Home Provider Location: Office  PCP:  Clayborn Heron, MD  Cardiologist:  No primary care provider on file.  Electrophysiologist:  None   Evaluation Performed:  Follow-Up Visit  Chief Complaint: Palpitations and dyspnea  History of Present Illness:    Crystal Parks is a 43 y.o. female with no significant past medical history.  She was evaluated by me for chest discomfort.  Her stress test was unremarkable and her calcium score was 0.  The patient does not have symptoms concerning for COVID-19 infection (fever, chills, cough, or new shortness of breath).    Past Medical History:  Diagnosis Date  . ADHD (attention deficit hyperactivity disorder)   . Thyroid disease    Past Surgical History:  Procedure Laterality Date  . BREAST SURGERY    . ceas     c-section  . REDUCTION MAMMAPLASTY Bilateral    age 44     No outpatient medications have been marked as taking for the 09/01/18 encounter (Telemedicine) with Revankar, Aundra Dubin, MD.     Allergies:   Ciprofloxacin   Social History   Tobacco Use  . Smoking status: Never Smoker  . Smokeless tobacco: Never Used  Substance Use Topics  . Alcohol use: Yes    Comment: occ  . Drug  use: No     Family Hx: The patient's family history is not on file.  ROS:   Please see the history of present illness.    As mentioned above All other systems reviewed and are negative.   Prior CV studies:   The following studies were reviewed today:  I discussed findings of calcium scoring and the result was 0.  Labs/Other Tests and Data Reviewed:    EKG:  No ECG reviewed.  Recent Labs: No results found for requested labs within last 8760 hours.   Recent Lipid Panel No results found for: CHOL, TRIG, HDL, CHOLHDL, LDLCALC, LDLDIRECT  Wt Readings from Last 3 Encounters:  09/01/18 165 lb (74.8 kg)  07/21/18 173 lb (78.5 kg)  06/30/18 168 lb (76.2 kg)     Objective:    Vital Signs:  Pulse 94   Ht 5\' 4"  (1.626 m)   Wt 165 lb (74.8 kg)   BMI 28.32 kg/m    VITAL SIGNS:  reviewed  ASSESSMENT & PLAN:    1. Chest discomfort: Discussed my findings with the patient chest pain is known coronary and other she has some inappropriate sinus tachycardia and her thyroid is regulated care physician.  Again these issues were discussed and I reassured her.  She will be seen in follow-up appointment on a as needed basis only.  She had multiple questions which were answered to her satisfaction.  COVID-19 Education: The signs and  symptoms of COVID-19 were discussed with the patient and how to seek care for testing (follow up with PCP or arrange E-visit).  The importance of social distancing was discussed today.  Time:   Today, I have spent 15 minutes with the patient with telehealth technology discussing the above problems.     Medication Adjustments/Labs and Tests Ordered: Current medicines are reviewed at length with the patient today.  Concerns regarding medicines are outlined above.   Tests Ordered: No orders of the defined types were placed in this encounter.   Medication Changes: No orders of the defined types were placed in this encounter.   Follow Up:  Virtual Visit  or In Person prn  Signed, Garwin Brothers, MD  09/01/2018 3:45 PM    [facility]

## 2018-09-01 NOTE — Telephone Encounter (Signed)
Phoned pt to review AVS. No answer, left voice message.

## 2018-09-01 NOTE — Patient Instructions (Signed)
Medication Instructions:  Your physician recommends that you continue on your current medications as directed. Please refer to the Current Medication list given to you today.  If you need a refill on your cardiac medications before your next appointment, please call your pharmacy.   Lab work: NONE If you have labs (blood work) drawn today and your tests are completely normal, you will receive your results only by: Marland Kitchen MyChart Message (if you have MyChart) OR . A paper copy in the mail If you have any lab test that is abnormal or we need to change your treatment, we will call you to review the results.  Testing/Procedures: NONE  Follow-Up: At [facility], you and your health needs are our priority.  As part of our continuing mission to provide you with exceptional heart care, we have created designated Provider Care Teams.  These Care Teams include your primary Cardiologist (physician) and Advanced Practice Providers (APPs -  Physician Assistants and Nurse Practitioners) who all work together to provide you with the care you need, when you need it. . You will need a follow up appointment AS NEEDED    Any Other Special Instructions Will Be Listed Below (If Applicable). NONE

## 2018-11-03 DIAGNOSIS — K648 Other hemorrhoids: Secondary | ICD-10-CM | POA: Diagnosis not present

## 2018-12-23 DIAGNOSIS — Z0183 Encounter for blood typing: Secondary | ICD-10-CM | POA: Diagnosis not present

## 2018-12-23 DIAGNOSIS — E039 Hypothyroidism, unspecified: Secondary | ICD-10-CM | POA: Diagnosis not present

## 2020-06-24 IMAGING — MG DIGITAL SCREENING BILATERAL MAMMOGRAM WITH TOMO AND CAD
8 series · 9 of 24 positions shown · non-contrast
Comparison: None.

CLINICAL DATA: Screening.

EXAM:
DIGITAL SCREENING BILATERAL MAMMOGRAM WITH TOMO AND CAD

[R CC synth-2D]
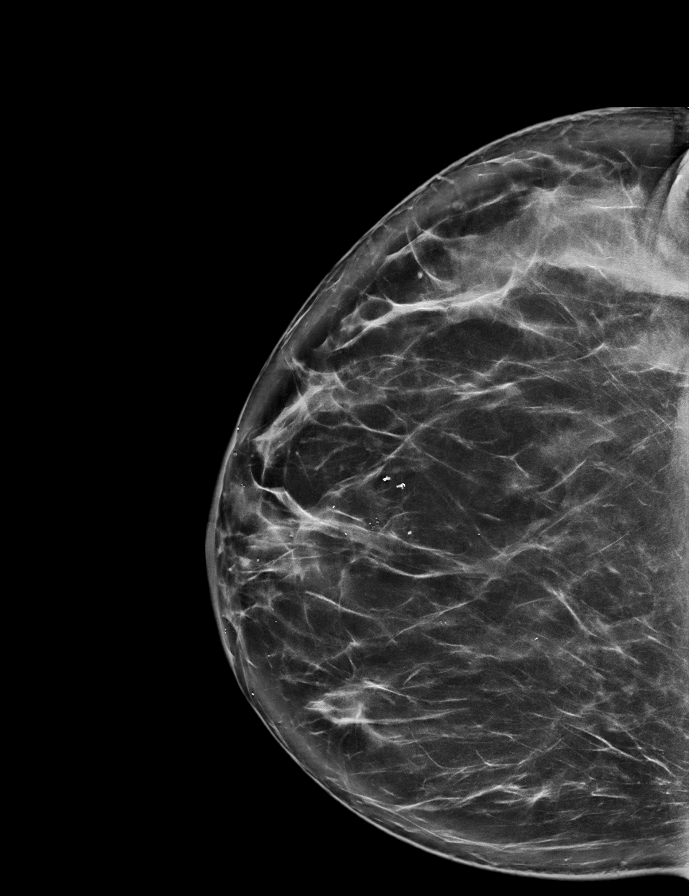

[R MLO synth-2D]
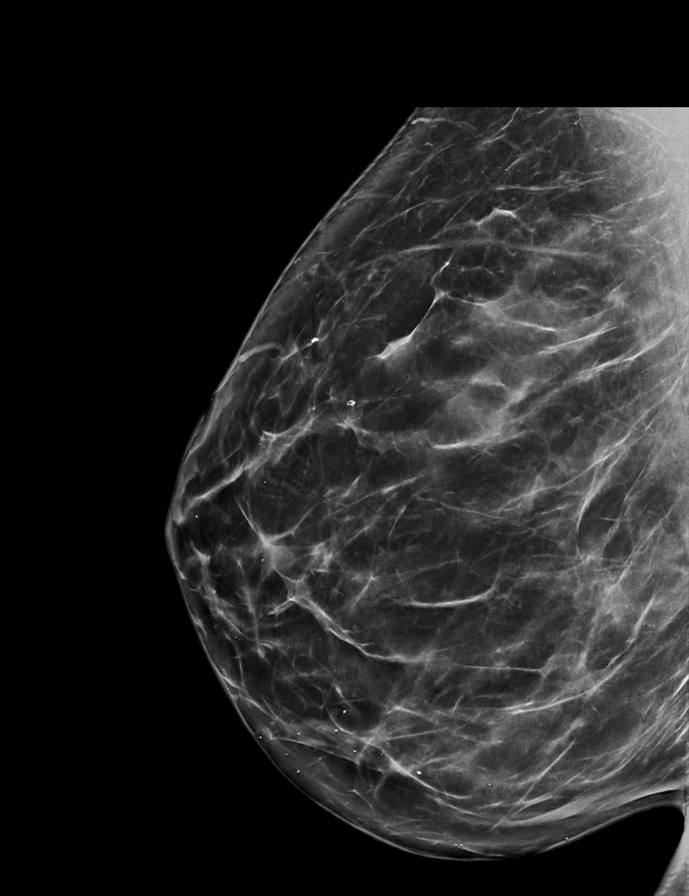

[L CC synth-2D]
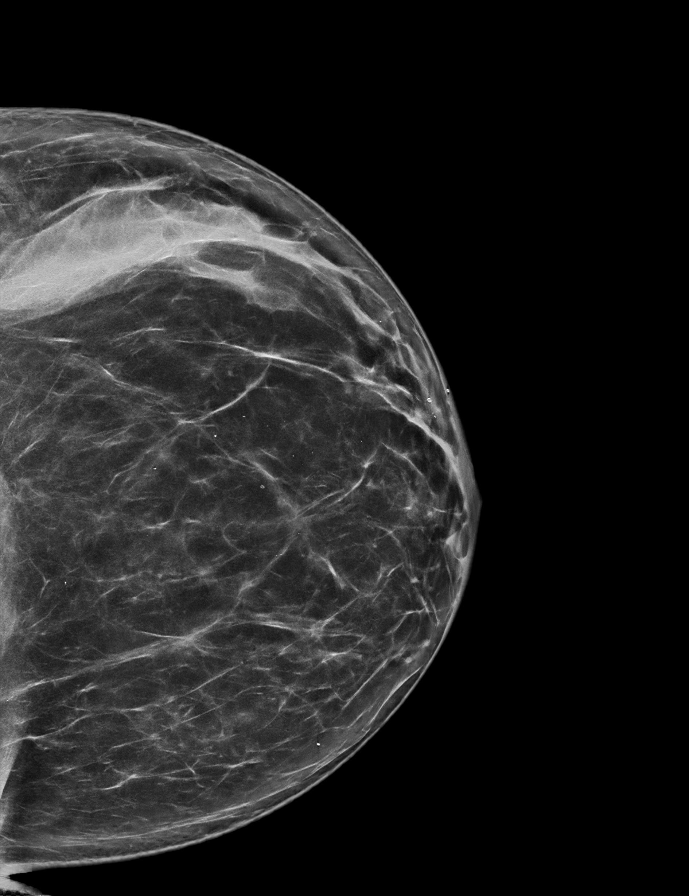

[L MLO synth-2D]
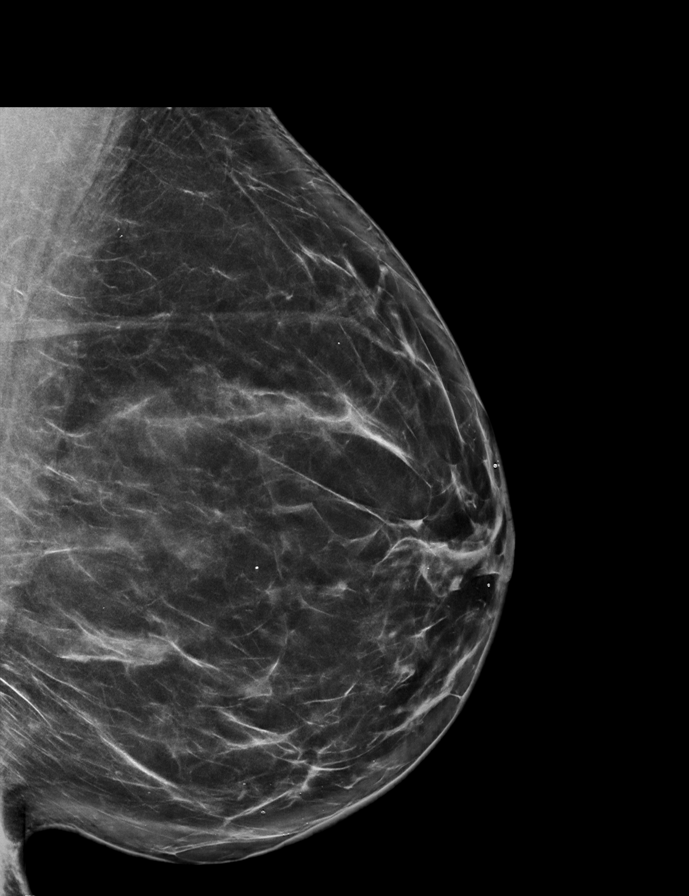

[R MLO tomo · 2 of 83 frames shown]
[frame 27/83]
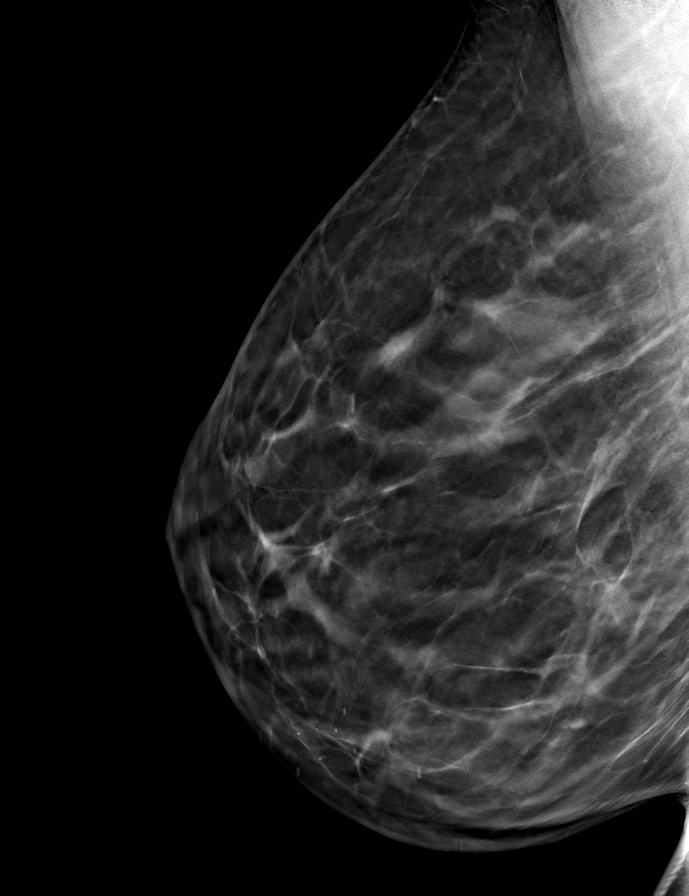
[frame 42/83]
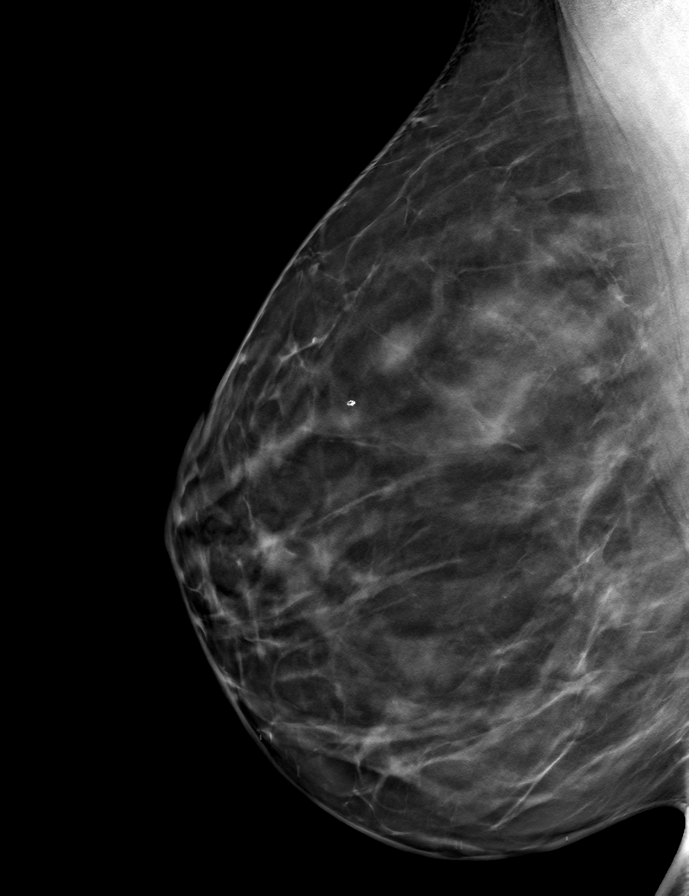

[R CC tomo · tomo slice 42/83.0]
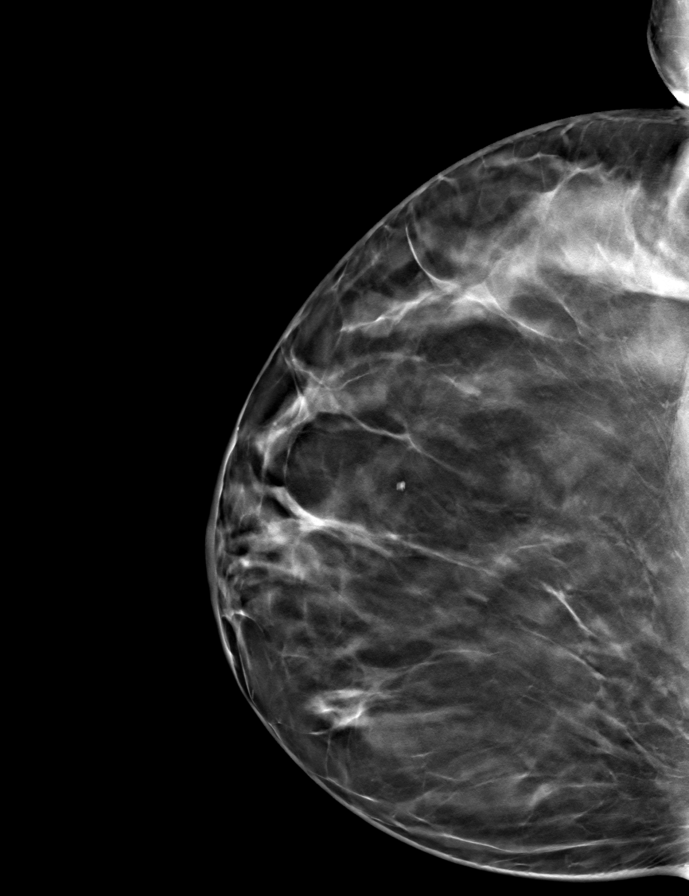

[L MLO tomo · tomo slice 41/80.0]
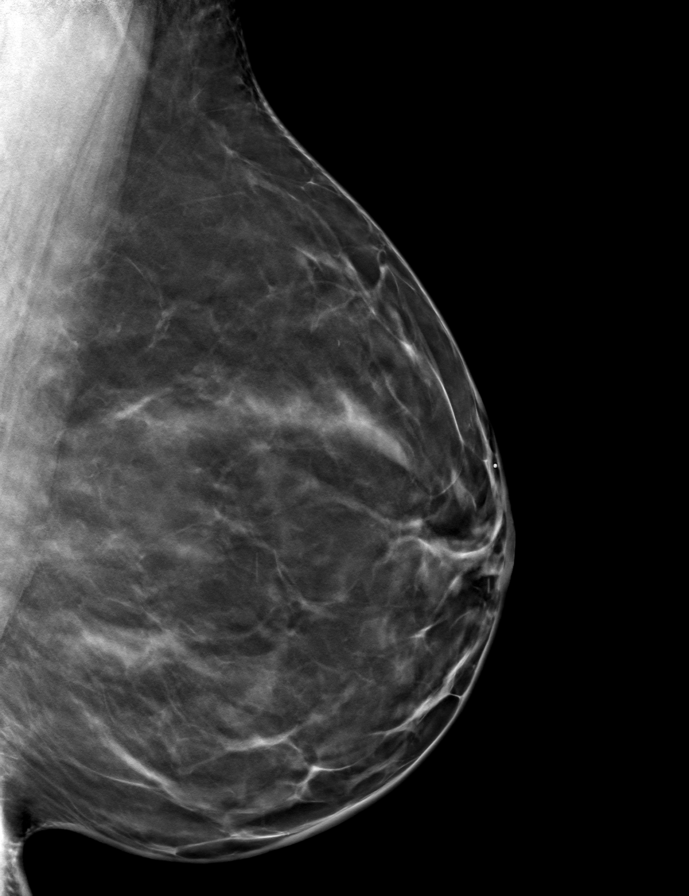

[L CC tomo · tomo slice 39/78.0]
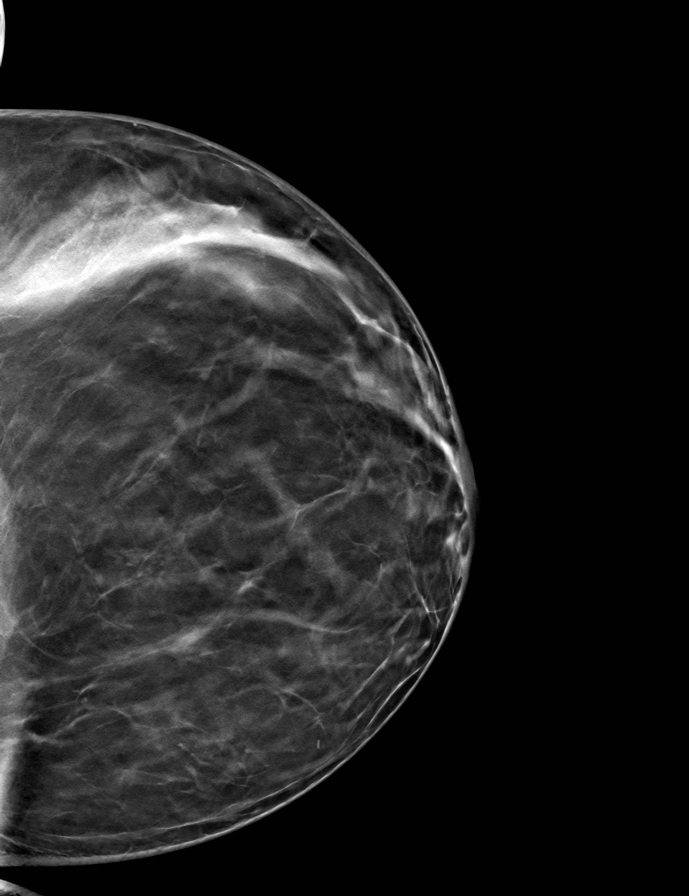

[9 of 24 positions shown; findings below may reference images not displayed]

ACR Breast Density Category c: The breast tissue is heterogeneously
dense, which may obscure small masses
FINDINGS: There are no findings suspicious for malignancy. Images were
processed with CAD.
IMPRESSION: No mammographic evidence of malignancy. A result letter of this
screening mammogram will be mailed directly to the patient.

RECOMMENDATION:
Screening mammogram in one year. (Code:EM-2-IHY)

BI-RADS CATEGORY  1: Negative.

## 2021-04-25 ENCOUNTER — Other Ambulatory Visit: Payer: Self-pay | Admitting: Sports Medicine

## 2021-04-26 ENCOUNTER — Other Ambulatory Visit: Payer: Self-pay | Admitting: Sports Medicine

## 2021-05-02 ENCOUNTER — Other Ambulatory Visit: Payer: Self-pay | Admitting: Sports Medicine

## 2021-05-02 DIAGNOSIS — M25511 Pain in right shoulder: Secondary | ICD-10-CM

## 2021-05-22 ENCOUNTER — Ambulatory Visit
Admission: RE | Admit: 2021-05-22 | Discharge: 2021-05-22 | Disposition: A | Discharge status: Home / Self Care | Payer: BC Managed Care – PPO | Source: Ambulatory Visit | Attending: Sports Medicine | Admitting: Sports Medicine

## 2021-05-22 ENCOUNTER — Ambulatory Visit
Admission: RE | Admit: 2021-05-22 | Discharge: 2021-05-22 | Disposition: A | Discharge status: Home / Self Care | Payer: Self-pay | Source: Ambulatory Visit | Attending: Sports Medicine | Admitting: Sports Medicine

## 2021-05-22 DIAGNOSIS — M25511 Pain in right shoulder: Secondary | ICD-10-CM

## 2021-05-22 MED ORDER — IOPAMIDOL (ISOVUE-M 200) INJECTION 41%
13.0000 mL | Freq: Once | INTRAMUSCULAR | Status: AC
  Administered 2021-05-22: 13 mL via INTRA_ARTICULAR

## 2022-04-04 ENCOUNTER — Ambulatory Visit
Admission: RE | Admit: 2022-04-04 | Discharge: 2022-04-04 | Disposition: A | Discharge status: Home / Self Care | Payer: 59 | Source: Ambulatory Visit | Attending: Sports Medicine | Admitting: Sports Medicine

## 2022-04-04 ENCOUNTER — Other Ambulatory Visit: Payer: Self-pay | Admitting: Sports Medicine

## 2022-04-04 DIAGNOSIS — M25562 Pain in left knee: Secondary | ICD-10-CM

## 2022-04-05 ENCOUNTER — Other Ambulatory Visit: Payer: Self-pay | Admitting: Sports Medicine

## 2022-04-05 DIAGNOSIS — M25562 Pain in left knee: Secondary | ICD-10-CM

## 2022-04-22 ENCOUNTER — Ambulatory Visit
Admission: RE | Admit: 2022-04-22 | Discharge: 2022-04-22 | Disposition: A | Discharge status: Home / Self Care | Payer: 59 | Source: Ambulatory Visit | Attending: Sports Medicine | Admitting: Sports Medicine

## 2022-04-22 DIAGNOSIS — M25562 Pain in left knee: Secondary | ICD-10-CM

## 2022-05-16 ENCOUNTER — Ambulatory Visit (HOSPITAL_BASED_OUTPATIENT_CLINIC_OR_DEPARTMENT_OTHER): Payer: Self-pay | Admitting: Orthopaedic Surgery

## 2022-05-16 ENCOUNTER — Encounter (HOSPITAL_BASED_OUTPATIENT_CLINIC_OR_DEPARTMENT_OTHER): Payer: Self-pay | Admitting: Orthopaedic Surgery

## 2022-05-16 ENCOUNTER — Other Ambulatory Visit (HOSPITAL_BASED_OUTPATIENT_CLINIC_OR_DEPARTMENT_OTHER): Payer: Self-pay

## 2022-05-16 ENCOUNTER — Ambulatory Visit (INDEPENDENT_AMBULATORY_CARE_PROVIDER_SITE_OTHER): Payer: 59 | Admitting: Orthopaedic Surgery

## 2022-05-16 DIAGNOSIS — S83242A Other tear of medial meniscus, current injury, left knee, initial encounter: Secondary | ICD-10-CM

## 2022-05-16 MED ORDER — ASPIRIN 325 MG PO TBEC
325.0000 mg | DELAYED_RELEASE_TABLET | Freq: Every day | ORAL | 0 refills | Status: AC
  Filled 2022-05-16 – 2022-09-24 (×2): qty 14, 14d supply, fill #0

## 2022-05-16 MED ORDER — IBUPROFEN 800 MG PO TABS
800.0000 mg | ORAL_TABLET | Freq: Three times a day (TID) | ORAL | 0 refills | Status: AC
  Filled 2022-05-16 – 2022-09-24 (×2): qty 30, 10d supply, fill #0

## 2022-05-16 MED ORDER — ACETAMINOPHEN 500 MG PO TABS
500.0000 mg | ORAL_TABLET | Freq: Three times a day (TID) | ORAL | 0 refills | Status: AC
  Filled 2022-05-16 – 2022-09-24 (×2): qty 30, 10d supply, fill #0

## 2022-05-16 NOTE — Progress Notes (Signed)
Chief Complaint: Left knee pain     History of Present Illness:    Crystal Parks is a 47 y.o. female presents today with left knee pain in the setting of a persistent medial meniscal tear.  She has had pain for 6 months.  She is experience mechanical clicking and popping.  She is significantly involved in martial arts and this knee pain is limiting her ability to be active whatsoever.  She has previously undergone physical therapy without any relief.  She is here today as she is having pain with most activities which are dramatically limiting her ability to be active.  She did not make any improvements with physical therapy.    Surgical History:    None   PMH/PSH/Family History/Social History/Meds/Allergies:    Past Medical History:  Diagnosis Date   ADHD (attention deficit hyperactivity disorder)    Thyroid disease    Past Surgical History:  Procedure Laterality Date   BREAST SURGERY     ceas     c-section   REDUCTION MAMMAPLASTY Bilateral    age 55   Social History   Socioeconomic History   Marital status: Married    Spouse name: Not on file   Number of children: Not on file   Years of education: Not on file   Highest education level: Not on file  Occupational History   Not on file  Tobacco Use   Smoking status: Never   Smokeless tobacco: Never  Substance and Sexual Activity   Alcohol use: Yes    Comment: occ   Drug use: No   Sexual activity: Not on file  Other Topics Concern   Not on file  Social History Narrative   Not on file   Social Determinants of Health   Financial Resource Strain: Not on file  Food Insecurity: Not on file  Transportation Needs: Not on file  Physical Activity: Not on file  Stress: Not on file  Social Connections: Not on file   History reviewed. No pertinent family history. Allergies  Allergen Reactions   Ciprofloxacin Itching, Nausea And Vomiting and Rash    Halucinations   Current  Outpatient Medications  Medication Sig Dispense Refill   acetaminophen (TYLENOL) 500 MG tablet Take 1 tablet (500 mg total) by mouth every 8 (eight) hours for 10 days. 30 tablet 0   aspirin EC 325 MG tablet Take 1 tablet (325 mg total) by mouth daily. 14 tablet 0   ibuprofen (ADVIL) 800 MG tablet Take 1 tablet (800 mg total) by mouth every 8 (eight) hours for 10 days. Please take with food, please alternate with acetaminophen 30 tablet 0   ALPRAZolam (XANAX) 0.5 MG tablet Take 1 tablet by mouth as needed.     magnesium hydroxide (MILK OF MAGNESIA) 800 MG/5ML suspension Take by mouth daily as needed for constipation.     thyroid (ARMOUR) 60 MG tablet Take 1 tablet by mouth daily.     zinc gluconate 50 MG tablet Take 50 mg by mouth daily.     No current facility-administered medications for this visit.   No results found.  Review of Systems:   A ROS was performed including pertinent positives and negatives as documented in the HPI.  Physical Exam :   Constitutional: NAD and appears stated age Neurological: Alert and oriented  Psych: Appropriate affect and cooperative There were no vitals taken for this visit.   Comprehensive Musculoskeletal Exam:      Musculoskeletal Exam  Gait Normal  Alignment Normal   Right Left  Inspection Normal Normal  Palpation    Tenderness None Medial joint line  Crepitus None None  Effusion  Trace  Range of Motion    Extension -3 -3  Flexion 135 100 with pain  Strength    Extension 5/5 5/5  Flexion 5/5 5/5  Ligament Exam     Generalized Laxity No No  Lachman Negative Negative   Pivot Shift Negative Negative  Anterior Drawer Negative Negative  Valgus at 0 Negative Negative  Valgus at 20 Negative Negative  Varus at 0 0 0  Varus at 20   0 0  Posterior Drawer at 90 0 0  Vascular/Lymphatic Exam    Edema None None  Venous Stasis Changes No No  Distal Circulation Normal Normal  Neurologic    Light Touch Sensation Intact Intact  Special Tests:  Positive McMurray's left knee medially     Imaging:   Xray (4 views left knee): Normal  MRI (left knee): Horizontal cleavage tear involving the medial meniscus  I personally reviewed and interpreted the radiographs.   Assessment:   47 y.o. female presents today with a left knee horizontal cleavage tear involving the medial meniscus.  This time she has failed physical therapy and she is significantly limited in her ability to be active as a result of this.  We did discuss additional treatment options.  I did discuss the role for left knee arthroscopy with medial meniscal repair.  I did discuss limitations restrictions associated with this.  After long discussion she has elected for this.  Plan :    -Plan for left knee arthroscopy with medial meniscal repair   After a lengthy discussion of treatment options, including risks, benefits, alternatives, complications of surgical and nonsurgical conservative options, the patient elected surgical repair.   The patient  is aware of the material risks  and complications including, but not limited to injury to adjacent structures, neurovascular injury, infection, numbness, bleeding, implant failure, thermal burns, stiffness, persistent pain, failure to heal, disease transmission from allograft, need for further surgery, dislocation, anesthetic risks, blood clots, risks of death,and others. The probabilities of surgical success and failure discussed with patient given their particular co-morbidities.The time and nature of expected rehabilitation and recovery was discussed.The patient's questions were all answered preoperatively.  No barriers to understanding were noted. I explained the natural history of the disease process and Rx rationale.  I explained to the patient what I considered to be reasonable expectations given their personal situation.  The final treatment plan was arrived at through a shared patient decision making process  model.      I personally saw and evaluated the patient, and participated in the management and treatment plan.  Huel Cote, MD Attending Physician, Orthopedic Surgery  This document was dictated using Dragon voice recognition software. A reasonable attempt at proof reading has been made to minimize errors.

## 2022-05-16 NOTE — H&P (View-Only) (Signed)
                               Chief Complaint: Left knee pain     History of Present Illness:    Crystal Parks is a 47 y.o. female presents today with left knee pain in the setting of a persistent medial meniscal tear.  She has had pain for 6 months.  She is experience mechanical clicking and popping.  She is significantly involved in martial arts and this knee pain is limiting her ability to be active whatsoever.  She has previously undergone physical therapy without any relief.  She is here today as she is having pain with most activities which are dramatically limiting her ability to be active.  She did not make any improvements with physical therapy.    Surgical History:    None   PMH/PSH/Family History/Social History/Meds/Allergies:    Past Medical History:  Diagnosis Date   ADHD (attention deficit hyperactivity disorder)    Thyroid disease    Past Surgical History:  Procedure Laterality Date   BREAST SURGERY     ceas     c-section   REDUCTION MAMMAPLASTY Bilateral    age 47   Social History   Socioeconomic History   Marital status: Married    Spouse name: Not on file   Number of children: Not on file   Years of education: Not on file   Highest education level: Not on file  Occupational History   Not on file  Tobacco Use   Smoking status: Never   Smokeless tobacco: Never  Substance and Sexual Activity   Alcohol use: Yes    Comment: occ   Drug use: No   Sexual activity: Not on file  Other Topics Concern   Not on file  Social History Narrative   Not on file   Social Determinants of Health   Financial Resource Strain: Not on file  Food Insecurity: Not on file  Transportation Needs: Not on file  Physical Activity: Not on file  Stress: Not on file  Social Connections: Not on file   History reviewed. No pertinent family history. Allergies  Allergen Reactions   Ciprofloxacin Itching, Nausea And Vomiting and Rash    Halucinations   Current  Outpatient Medications  Medication Sig Dispense Refill   acetaminophen (TYLENOL) 500 MG tablet Take 1 tablet (500 mg total) by mouth every 8 (eight) hours for 10 days. 30 tablet 0   aspirin EC 325 MG tablet Take 1 tablet (325 mg total) by mouth daily. 14 tablet 0   ibuprofen (ADVIL) 800 MG tablet Take 1 tablet (800 mg total) by mouth every 8 (eight) hours for 10 days. Please take with food, please alternate with acetaminophen 30 tablet 0   ALPRAZolam (XANAX) 0.5 MG tablet Take 1 tablet by mouth as needed.     magnesium hydroxide (MILK OF MAGNESIA) 800 MG/5ML suspension Take by mouth daily as needed for constipation.     thyroid (ARMOUR) 60 MG tablet Take 1 tablet by mouth daily.     zinc gluconate 50 MG tablet Take 50 mg by mouth daily.     No current facility-administered medications for this visit.   No results found.  Review of Systems:   A ROS was performed including pertinent positives and negatives as documented in the HPI.  Physical Exam :   Constitutional: NAD and appears stated age Neurological: Alert and oriented   Psych: Appropriate affect and cooperative There were no vitals taken for this visit.   Comprehensive Musculoskeletal Exam:      Musculoskeletal Exam  Gait Normal  Alignment Normal   Right Left  Inspection Normal Normal  Palpation    Tenderness None Medial joint line  Crepitus None None  Effusion  Trace  Range of Motion    Extension -3 -3  Flexion 135 100 with pain  Strength    Extension 5/5 5/5  Flexion 5/5 5/5  Ligament Exam     Generalized Laxity No No  Lachman Negative Negative   Pivot Shift Negative Negative  Anterior Drawer Negative Negative  Valgus at 0 Negative Negative  Valgus at 20 Negative Negative  Varus at 0 0 0  Varus at 20   0 0  Posterior Drawer at 90 0 0  Vascular/Lymphatic Exam    Edema None None  Venous Stasis Changes No No  Distal Circulation Normal Normal  Neurologic    Light Touch Sensation Intact Intact  Special Tests:  Positive McMurray's left knee medially     Imaging:   Xray (4 views left knee): Normal  MRI (left knee): Horizontal cleavage tear involving the medial meniscus  I personally reviewed and interpreted the radiographs.   Assessment:   47 y.o. female presents today with a left knee horizontal cleavage tear involving the medial meniscus.  This time she has failed physical therapy and she is significantly limited in her ability to be active as a result of this.  We did discuss additional treatment options.  I did discuss the role for left knee arthroscopy with medial meniscal repair.  I did discuss limitations restrictions associated with this.  After long discussion she has elected for this.  Plan :    -Plan for left knee arthroscopy with medial meniscal repair   After a lengthy discussion of treatment options, including risks, benefits, alternatives, complications of surgical and nonsurgical conservative options, the patient elected surgical repair.   The patient  is aware of the material risks  and complications including, but not limited to injury to adjacent structures, neurovascular injury, infection, numbness, bleeding, implant failure, thermal burns, stiffness, persistent pain, failure to heal, disease transmission from allograft, need for further surgery, dislocation, anesthetic risks, blood clots, risks of death,and others. The probabilities of surgical success and failure discussed with patient given their particular co-morbidities.The time and nature of expected rehabilitation and recovery was discussed.The patient's questions were all answered preoperatively.  No barriers to understanding were noted. I explained the natural history of the disease process and Rx rationale.  I explained to the patient what I considered to be reasonable expectations given their personal situation.  The final treatment plan was arrived at through a shared patient decision making process  model.      I personally saw and evaluated the patient, and participated in the management and treatment plan.  Mariadelcarmen Corella, MD Attending Physician, Orthopedic Surgery  This document was dictated using Dragon voice recognition software. A reasonable attempt at proof reading has been made to minimize errors. 

## 2022-05-25 ENCOUNTER — Other Ambulatory Visit (HOSPITAL_BASED_OUTPATIENT_CLINIC_OR_DEPARTMENT_OTHER): Payer: Self-pay

## 2022-05-25 NOTE — Pre-Procedure Instructions (Signed)
Surgical Instructions    Your procedure is scheduled on Monday, May 6th.  Report to [facility] Main Entrance "A" at 1:15 P.M., then check in with the Admitting office.  Call this number if you have problems the morning of surgery:  [phone]  If you have any questions prior to your surgery date call [phone]: Open Monday-Friday 8am-4pm If you experience any cold or flu symptoms such as cough, fever, chills, shortness of breath, etc. between now and your scheduled surgery, please notify us at the above number.     Remember:  Do not eat after midnight the night before your surgery  You may drink clear liquids until 12:15 PM the day of your surgery.   Clear liquids allowed are: Water, Non-Citrus Juices (without pulp), Carbonated Beverages, Clear Tea, Black Coffee Only (NO MILK, CREAM OR POWDERED CREAMER of any kind), and Gatorade.   Patient Instructions  The night before surgery:  No food after midnight. ONLY clear liquids after midnight  The day of surgery (if you do NOT have diabetes):  Drink ONE (1) Pre-Surgery Clear Ensure by 11:15 AM the morning of surgery. Drink in one sitting. Do not sip.  This drink was given to you during your hospital  pre-op appointment visit.  Nothing else to drink after completing the  Pre-Surgery Clear Ensure.          If you have questions, please contact your surgeon's office.     Take these medicines the morning of surgery with A SIP OF WATER  NP THYROID  thyroid (ARMOUR)    If needed: acetaminophen (TYLENOL)  ALPRAZolam (XANAX)    Hold Mounjaro 7 days prior to surgery. Last dose 4/22.  Follow your surgeon's instructions on when to stop Aspirin.  If no instructions were given by your surgeon then you will need to call the office to get those instructions.     As of today, STOP taking any Aleve, Naproxen, Ibuprofen, Motrin, Advil, Goody's, BC's, all herbal medications, fish oil, and all vitamins.                     Do NOT Smoke  (Tobacco/Vaping) for 24 hours prior to your procedure.  If you use a CPAP at night, you may bring your mask/headgear for your overnight stay.   Contacts, glasses, piercing's, hearing aid's, dentures or partials may not be worn into surgery, please bring cases for these belongings.    For patients admitted to the hospital, discharge time will be determined by your treatment team.   Patients discharged the day of surgery will not be allowed to drive home, and someone needs to stay with them for 24 hours.  SURGICAL WAITING ROOM VISITATION Patients having surgery or a procedure may have no more than 2 support people in the waiting area - these visitors may rotate.   Children under the age of 93 must have an adult with them who is not the patient. If the patient needs to stay at the hospital during part of their recovery, the visitor guidelines for inpatient rooms apply. Pre-op nurse will coordinate an appropriate time for 1 support person to accompany patient in pre-op.  This support person may not rotate.   Please refer to the [facility] website for the visitor guidelines for Inpatients (after your surgery is over and you are in a regular room).    Special instructions:   [facility]- Preparing For Surgery  Before surgery, you can play an important role. Because skin  is not sterile, your skin needs to be as free of germs as possible. You can reduce the number of germs on your skin by washing with CHG (chlorahexidine gluconate) Soap before surgery.  CHG is an antiseptic cleaner which kills germs and bonds with the skin to continue killing germs even after washing.    Oral Hygiene is also important to reduce your risk of infection.  Remember - BRUSH YOUR TEETH THE MORNING OF SURGERY WITH YOUR REGULAR TOOTHPASTE  Please do not use if you have an allergy to CHG or antibacterial soaps. If your skin becomes reddened/irritated stop using the CHG.  Do not shave (including legs and underarms) for at  least 48 hours prior to first CHG shower. It is OK to shave your face.  Please follow these instructions carefully.   Shower the NIGHT BEFORE SURGERY and the MORNING OF SURGERY  If you chose to wash your hair, wash your hair first as usual with your normal shampoo.  After you shampoo, rinse your hair and body thoroughly to remove the shampoo.  Use CHG Soap as you would any other liquid soap. You can apply CHG directly to the skin and wash gently with a scrungie or a clean washcloth.   Apply the CHG Soap to your body ONLY FROM THE NECK DOWN.  Do not use on open wounds or open sores. Avoid contact with your eyes, ears, mouth and genitals (private parts). Wash Face and genitals (private parts)  with your normal soap.   Wash thoroughly, paying special attention to the area where your surgery will be performed.  Thoroughly rinse your body with warm water from the neck down.  DO NOT shower/wash with your normal soap after using and rinsing off the CHG Soap.  Pat yourself dry with a CLEAN TOWEL.  Wear CLEAN PAJAMAS to bed the night before surgery  Place CLEAN SHEETS on your bed the night before your surgery  DO NOT SLEEP WITH PETS.   Day of Surgery: Take a shower with CHG soap. Do not wear jewelry or makeup Do not wear lotions, powders, perfumes, or deodorant. Do not shave 48 hours prior to surgery.   Do not bring valuables to the hospital. [facility] is not responsible for any belongings or valuables. Do not wear nail polish, gel polish, artificial nails, or any other type of covering on natural nails (fingers and toes) If you have artificial nails or gel coating that need to be removed by a nail salon, please have this removed prior to surgery. Artificial nails or gel coating may interfere with anesthesia's ability to adequately monitor your vital signs. Wear Clean/Comfortable clothing the morning of surgery Remember to brush your teeth WITH YOUR REGULAR TOOTHPASTE.   Please read  over the following fact sheets that you were given.    If you received a COVID test during your pre-op visit  it is requested that you wear a mask when out in public, stay away from anyone that may not be feeling well and notify your surgeon if you develop symptoms. If you have been in contact with anyone that has tested positive in the last 10 days please notify you surgeon.

## 2022-05-28 ENCOUNTER — Other Ambulatory Visit: Payer: Self-pay

## 2022-05-28 ENCOUNTER — Encounter (HOSPITAL_COMMUNITY): Payer: Self-pay

## 2022-05-28 ENCOUNTER — Encounter (HOSPITAL_COMMUNITY)
Admission: RE | Admit: 2022-05-28 | Discharge: 2022-05-28 | Disposition: A | Discharge status: Home / Self Care | Payer: 59 | Source: Ambulatory Visit | Attending: Orthopaedic Surgery | Admitting: Orthopaedic Surgery

## 2022-05-28 VITALS — BP 113/74 | HR 74 | Temp 98.4°F | Resp 17 | Ht 64.0 in | Wt 137.0 lb

## 2022-05-28 DIAGNOSIS — Z01818 Encounter for other preprocedural examination: Secondary | ICD-10-CM

## 2022-05-28 DIAGNOSIS — Z01812 Encounter for preprocedural laboratory examination: Secondary | ICD-10-CM | POA: Insufficient documentation

## 2022-05-28 HISTORY — DX: Hypothyroidism, unspecified: E03.9

## 2022-05-28 HISTORY — DX: Unspecified osteoarthritis, unspecified site: M19.90

## 2022-05-28 HISTORY — DX: Headache, unspecified: R51.9

## 2022-05-28 HISTORY — DX: Other complications of anesthesia, initial encounter: T88.59XA

## 2022-05-28 LAB — CBC
HCT: 39.7 % (ref 36.0–46.0)
Hemoglobin: 12.7 g/dL (ref 12.0–15.0)
MCH: 29.8 pg (ref 26.0–34.0)
MCHC: 32 g/dL (ref 30.0–36.0)
MCV: 93.2 fL (ref 80.0–100.0)
Platelets: 304 10*3/uL (ref 150–400)
RBC: 4.26 MIL/uL (ref 3.87–5.11)
RDW: 12.9 % (ref 11.5–15.5)
WBC: 9.8 10*3/uL (ref 4.0–10.5)
nRBC: 0 % (ref 0.0–0.2)

## 2022-05-28 NOTE — Progress Notes (Signed)
PCP - Marykay Lex, NP [facility] Medicine Cardiologist - Dr. Belva Crome (Only saw to have work-up completed due to family hx of cardiac issues. All results normal. PRN follow-up and has not needed to do so)  PPM/ICD - Denies Device Orders - n/a Rep Notified - n/a  Chest x-ray - n/a  EKG - 06/11/2018 Stress Test - 07/21/2018 ECHO - 07/21/2018 Cardiac Cath - Denies  Sleep Study - Denies  CPAP - n/a  No DM  Last dose of GLP1 agonist- Last Dose of [facility] April 22nd. GLP1 instructions: Pt will continue to hold medication until after surgery  Blood Thinner Instructions: n/a Aspirin Instructions: ASA that is on med list is prescribed for post-op and pt is aware  ERAS Protcol - Clear liquids until 1215 afternoon of surgery PRE-SURGERY Ensure or G2- Ensure given to pt with instructions  COVID TEST- n/a   Anesthesia review: No.   Patient denies shortness of breath, fever, cough and chest pain at PAT appointment. Pt denies any respiratory illness/infection in the last two months.   All instructions explained to the patient, with a verbal understanding of the material. Patient agrees to go over the instructions while at home for a better understanding. Patient also instructed to self quarantine after being tested for COVID-19. The opportunity to ask questions was provided.

## 2022-06-01 NOTE — Progress Notes (Signed)
  Patient voiced understanding of new arrival time of 1115 on monday. Finish ERAS drink by 1015.

## 2022-06-04 ENCOUNTER — Other Ambulatory Visit: Payer: Self-pay

## 2022-06-04 ENCOUNTER — Encounter (HOSPITAL_COMMUNITY)
Admission: RE | Disposition: A | Discharge status: Home / Self Care | Payer: Self-pay | Source: Home / Self Care | Attending: Orthopaedic Surgery

## 2022-06-04 ENCOUNTER — Ambulatory Visit (HOSPITAL_COMMUNITY): Payer: 59 | Admitting: Anesthesiology

## 2022-06-04 ENCOUNTER — Ambulatory Visit (HOSPITAL_COMMUNITY)
Admission: RE | Admit: 2022-06-04 | Discharge: 2022-06-04 | Disposition: A | Discharge status: Home / Self Care | Payer: 59 | Attending: Orthopaedic Surgery | Admitting: Orthopaedic Surgery

## 2022-06-04 ENCOUNTER — Ambulatory Visit (HOSPITAL_BASED_OUTPATIENT_CLINIC_OR_DEPARTMENT_OTHER): Payer: 59 | Admitting: Anesthesiology

## 2022-06-04 ENCOUNTER — Encounter (HOSPITAL_COMMUNITY): Payer: Self-pay | Admitting: Orthopaedic Surgery

## 2022-06-04 DIAGNOSIS — X58XXXA Exposure to other specified factors, initial encounter: Secondary | ICD-10-CM | POA: Diagnosis not present

## 2022-06-04 DIAGNOSIS — J45909 Unspecified asthma, uncomplicated: Secondary | ICD-10-CM

## 2022-06-04 DIAGNOSIS — E039 Hypothyroidism, unspecified: Secondary | ICD-10-CM | POA: Diagnosis not present

## 2022-06-04 DIAGNOSIS — S83242A Other tear of medial meniscus, current injury, left knee, initial encounter: Secondary | ICD-10-CM | POA: Insufficient documentation

## 2022-06-04 DIAGNOSIS — S83242D Other tear of medial meniscus, current injury, left knee, subsequent encounter: Secondary | ICD-10-CM | POA: Diagnosis not present

## 2022-06-04 HISTORY — PX: KNEE ARTHROSCOPY WITH MENISCAL REPAIR: SHX5653

## 2022-06-04 LAB — POCT PREGNANCY, URINE: Preg Test, Ur: NEGATIVE

## 2022-06-04 SURGERY — ARTHROSCOPY, KNEE, WITH MENISCUS REPAIR
Anesthesia: General | Site: Knee | Laterality: Left

## 2022-06-04 MED ORDER — FENTANYL CITRATE (PF) 100 MCG/2ML IJ SOLN
25.0000 ug | INTRAMUSCULAR | Status: DC | PRN
  Administered 2022-06-04: 50 ug via INTRAVENOUS

## 2022-06-04 MED ORDER — 0.9 % SODIUM CHLORIDE (POUR BTL) OPTIME
TOPICAL | Status: DC | PRN
  Administered 2022-06-04: 1000 mL

## 2022-06-04 MED ORDER — OXYCODONE HCL 5 MG/5ML PO SOLN: 5.0000 mg | Freq: Once | ORAL | Status: AC | PRN

## 2022-06-04 MED ORDER — TRANEXAMIC ACID-NACL 1000-0.7 MG/100ML-% IV SOLN
1000.0000 mg | INTRAVENOUS | Status: AC
  Administered 2022-06-04: 1000 mg via INTRAVENOUS

## 2022-06-04 MED ORDER — ONDANSETRON HCL 4 MG/2ML IJ SOLN
INTRAMUSCULAR | Status: DC | PRN
  Administered 2022-06-04: 4 mg via INTRAVENOUS

## 2022-06-04 MED ORDER — ACETAMINOPHEN 500 MG PO TABS: 1000.0000 mg | ORAL_TABLET | Freq: Once | ORAL | Status: AC

## 2022-06-04 MED ORDER — DEXMEDETOMIDINE HCL IN NACL 80 MCG/20ML IV SOLN
INTRAVENOUS | Status: DC | PRN
  Administered 2022-06-04: 4 ug via INTRAVENOUS

## 2022-06-04 MED ORDER — EPINEPHRINE PF 1 MG/ML IJ SOLN
INTRAMUSCULAR | Status: AC
  Filled 2022-06-04: qty 2

## 2022-06-04 MED ORDER — OXYCODONE HCL 5 MG PO TABS
5.0000 mg | ORAL_TABLET | Freq: Once | ORAL | Status: AC | PRN
  Administered 2022-06-04: 5 mg via ORAL

## 2022-06-04 MED ORDER — AMISULPRIDE (ANTIEMETIC) 5 MG/2ML IV SOLN
10.0000 mg | Freq: Once | INTRAVENOUS | Status: AC | PRN
  Administered 2022-06-04: 10 mg via INTRAVENOUS

## 2022-06-04 MED ORDER — DEXAMETHASONE SODIUM PHOSPHATE 10 MG/ML IJ SOLN
INTRAMUSCULAR | Status: DC | PRN
  Administered 2022-06-04: 10 mg via INTRAVENOUS

## 2022-06-04 MED ORDER — AMISULPRIDE (ANTIEMETIC) 5 MG/2ML IV SOLN
INTRAVENOUS | Status: AC
  Filled 2022-06-04: qty 4

## 2022-06-04 MED ORDER — CEFAZOLIN SODIUM-DEXTROSE 2-4 GM/100ML-% IV SOLN
INTRAVENOUS | Status: AC
  Filled 2022-06-04: qty 100

## 2022-06-04 MED ORDER — BUPIVACAINE HCL (PF) 0.25 % IJ SOLN
INTRAMUSCULAR | Status: AC
  Filled 2022-06-04: qty 30

## 2022-06-04 MED ORDER — OXYCODONE HCL 5 MG PO TABS: 5.0000 mg | ORAL_TABLET | ORAL | 0 refills | Status: AC | PRN

## 2022-06-04 MED ORDER — PHENYLEPHRINE 80 MCG/ML (10ML) SYRINGE FOR IV PUSH (FOR BLOOD PRESSURE SUPPORT)
PREFILLED_SYRINGE | INTRAVENOUS | Status: DC | PRN
  Administered 2022-06-04: 80 ug via INTRAVENOUS

## 2022-06-04 MED ORDER — CHLORHEXIDINE GLUCONATE 0.12 % MT SOLN
OROMUCOSAL | Status: AC
  Administered 2022-06-04: 15 mL via OROMUCOSAL
  Filled 2022-06-04: qty 15

## 2022-06-04 MED ORDER — PROPOFOL 10 MG/ML IV BOLUS
INTRAVENOUS | Status: AC
  Filled 2022-06-04: qty 20

## 2022-06-04 MED ORDER — LIDOCAINE 2% (20 MG/ML) 5 ML SYRINGE
INTRAMUSCULAR | Status: DC | PRN
  Administered 2022-06-04: 60 mg via INTRAVENOUS

## 2022-06-04 MED ORDER — OXYCODONE HCL 5 MG PO TABS
ORAL_TABLET | ORAL | Status: AC
  Filled 2022-06-04: qty 1

## 2022-06-04 MED ORDER — MIDAZOLAM HCL 2 MG/2ML IJ SOLN
INTRAMUSCULAR | Status: DC | PRN
  Administered 2022-06-04: 2 mg via INTRAVENOUS

## 2022-06-04 MED ORDER — MIDAZOLAM HCL 2 MG/2ML IJ SOLN
INTRAMUSCULAR | Status: AC
  Filled 2022-06-04: qty 2

## 2022-06-04 MED ORDER — SCOPOLAMINE 1 MG/3DAYS TD PT72
MEDICATED_PATCH | TRANSDERMAL | Status: AC
  Administered 2022-06-04: 1.5 mg via TRANSDERMAL
  Filled 2022-06-04: qty 1

## 2022-06-04 MED ORDER — EPHEDRINE SULFATE-NACL 50-0.9 MG/10ML-% IV SOSY
PREFILLED_SYRINGE | INTRAVENOUS | Status: DC | PRN
  Administered 2022-06-04 (×2): 5 mg via INTRAVENOUS
  Administered 2022-06-04: 2.5 mg via INTRAVENOUS

## 2022-06-04 MED ORDER — ORAL CARE MOUTH RINSE: 15.0000 mL | Freq: Once | OROMUCOSAL | Status: AC

## 2022-06-04 MED ORDER — EPINEPHRINE PF 1 MG/ML IJ SOLN
INTRAMUSCULAR | Status: AC
  Filled 2022-06-04: qty 1

## 2022-06-04 MED ORDER — PROPOFOL 10 MG/ML IV BOLUS
INTRAVENOUS | Status: DC | PRN
  Administered 2022-06-04: 200 mg via INTRAVENOUS

## 2022-06-04 MED ORDER — CHLORHEXIDINE GLUCONATE 0.12 % MT SOLN: 15.0000 mL | Freq: Once | OROMUCOSAL | Status: AC

## 2022-06-04 MED ORDER — ACETAMINOPHEN 500 MG PO TABS
ORAL_TABLET | ORAL | Status: AC
  Administered 2022-06-04: 1000 mg via ORAL
  Filled 2022-06-04: qty 2

## 2022-06-04 MED ORDER — LACTATED RINGERS IV SOLN: INTRAVENOUS | Status: DC

## 2022-06-04 MED ORDER — MEPERIDINE HCL 25 MG/ML IJ SOLN: 6.2500 mg | INTRAMUSCULAR | Status: DC | PRN

## 2022-06-04 MED ORDER — BUPIVACAINE HCL 0.25 % IJ SOLN
INTRAMUSCULAR | Status: DC | PRN
  Administered 2022-06-04: 17 mL

## 2022-06-04 MED ORDER — SODIUM CHLORIDE 0.9 % IR SOLN
Status: DC | PRN
  Administered 2022-06-04 (×2): 3001 mL

## 2022-06-04 MED ORDER — FENTANYL CITRATE (PF) 250 MCG/5ML IJ SOLN
INTRAMUSCULAR | Status: AC
  Filled 2022-06-04: qty 5

## 2022-06-04 MED ORDER — ONDANSETRON HCL 4 MG/2ML IJ SOLN
INTRAMUSCULAR | Status: AC
  Filled 2022-06-04: qty 2

## 2022-06-04 MED ORDER — CEFAZOLIN SODIUM-DEXTROSE 2-4 GM/100ML-% IV SOLN
2.0000 g | INTRAVENOUS | Status: AC
  Administered 2022-06-04: 2 g via INTRAVENOUS

## 2022-06-04 MED ORDER — FENTANYL CITRATE (PF) 100 MCG/2ML IJ SOLN
INTRAMUSCULAR | Status: AC
  Filled 2022-06-04: qty 2

## 2022-06-04 MED ORDER — SCOPOLAMINE 1 MG/3DAYS TD PT72: 1.0000 | MEDICATED_PATCH | TRANSDERMAL | Status: DC

## 2022-06-04 MED ORDER — GABAPENTIN 300 MG PO CAPS: 300.0000 mg | ORAL_CAPSULE | Freq: Once | ORAL | Status: AC

## 2022-06-04 MED ORDER — GABAPENTIN 300 MG PO CAPS
ORAL_CAPSULE | ORAL | Status: AC
  Administered 2022-06-04: 300 mg via ORAL
  Filled 2022-06-04: qty 1

## 2022-06-04 MED ORDER — FENTANYL CITRATE (PF) 250 MCG/5ML IJ SOLN
INTRAMUSCULAR | Status: DC | PRN
  Administered 2022-06-04: 50 ug via INTRAVENOUS

## 2022-06-04 SURGICAL SUPPLY — 74 items
ALCOHOL 70% 16 OZ (MISCELLANEOUS) ×1 IMPLANT
APL PRP STRL LF DISP 70% ISPRP (MISCELLANEOUS) ×2
BAG COUNTER SPONGE SURGICOUNT (BAG) ×1 IMPLANT
BAG SPNG CNTER NS LX DISP (BAG) ×1
BANDAGE ESMARK 6X9 LF (GAUZE/BANDAGES/DRESSINGS) IMPLANT
BLADE CLIPPER SURG (BLADE) IMPLANT
BLADE EXCALIBUR 4.0X13 (MISCELLANEOUS) ×1 IMPLANT
BLADE SHAVER TORPEDO 4X13 (MISCELLANEOUS) ×1 IMPLANT
BLADE SURG 10 STRL SS (BLADE) ×1 IMPLANT
BLADE SURG 15 STRL LF DISP TIS (BLADE) ×1 IMPLANT
BLADE SURG 15 STRL SS (BLADE) ×1
BNDG CMPR 9X6 STRL LF SNTH (GAUZE/BANDAGES/DRESSINGS)
BNDG CMPR MED 10X6 ELC LF (GAUZE/BANDAGES/DRESSINGS) ×1
BNDG ELASTIC 6X10 VLCR STRL LF (GAUZE/BANDAGES/DRESSINGS) IMPLANT
BNDG ELASTIC 6X5.8 VLCR STR LF (GAUZE/BANDAGES/DRESSINGS) IMPLANT
BNDG ESMARK 6X9 LF (GAUZE/BANDAGES/DRESSINGS)
CARTRIDGE SUT 2-0 NONSTITCH (Anchor) IMPLANT
CHLORAPREP W/TINT 26 (MISCELLANEOUS) ×1 IMPLANT
COOLER ICEMAN CLASSIC (MISCELLANEOUS) ×1 IMPLANT
COVER SURGICAL LIGHT HANDLE (MISCELLANEOUS) ×1 IMPLANT
CUFF TOURN SGL QUICK 34 (TOURNIQUET CUFF)
CUFF TOURN SGL QUICK 42 (TOURNIQUET CUFF) IMPLANT
CUFF TRNQT CYL 34X4.125X (TOURNIQUET CUFF) IMPLANT
DRAPE ARTHROSCOPY W/POUCH 114 (DRAPES) ×1 IMPLANT
DRAPE HALF SHEET 40X57 (DRAPES) IMPLANT
DRAPE INCISE IOBAN 66X45 STRL (DRAPES) IMPLANT
DRAPE U-SHAPE 47X51 STRL (DRAPES) ×1 IMPLANT
DRSG TEGADERM 4X4.75 (GAUZE/BANDAGES/DRESSINGS) ×3 IMPLANT
DW OUTFLOW CASSETTE/TUBE SET (MISCELLANEOUS) ×1 IMPLANT
ELECT REM PT RETURN 9FT ADLT (ELECTROSURGICAL) ×1
ELECTRODE REM PT RTRN 9FT ADLT (ELECTROSURGICAL) ×1 IMPLANT
EXCALIBUR 3.8MM X 13CM (MISCELLANEOUS) IMPLANT
GAUZE SPONGE 4X4 12PLY STRL (GAUZE/BANDAGES/DRESSINGS) IMPLANT
GAUZE XEROFORM 1X8 LF (GAUZE/BANDAGES/DRESSINGS) IMPLANT
GLOVE BIOGEL PI IND STRL 6.5 (GLOVE) ×1 IMPLANT
GLOVE BIOGEL PI IND STRL 8 (GLOVE) ×1 IMPLANT
GLOVE ECLIPSE 6.0 STRL STRAW (GLOVE) ×1 IMPLANT
GLOVE INDICATOR 8.0 STRL GRN (GLOVE) ×1 IMPLANT
GOWN STRL REUS W/ TWL LRG LVL3 (GOWN DISPOSABLE) ×2 IMPLANT
GOWN STRL REUS W/ TWL XL LVL3 (GOWN DISPOSABLE) ×1 IMPLANT
GOWN STRL REUS W/TWL LRG LVL3 (GOWN DISPOSABLE) ×2
GOWN STRL REUS W/TWL XL LVL3 (GOWN DISPOSABLE) ×1
KIT BASIN OR (CUSTOM PROCEDURE TRAY) ×1 IMPLANT
KIT TURNOVER KIT B (KITS) ×1 IMPLANT
MANIFOLD NEPTUNE II (INSTRUMENTS) IMPLANT
NDL 18GX1X1/2 (RX/OR ONLY) (NEEDLE) IMPLANT
NDL HYPO 25GX1X1/2 BEV (NEEDLE) ×1 IMPLANT
NDL SUT 2-0 SCORPION KNEE (NEEDLE) IMPLANT
NEEDLE 18GX1X1/2 (RX/OR ONLY) (NEEDLE) IMPLANT
NEEDLE HYPO 25GX1X1/2 BEV (NEEDLE) ×1 IMPLANT
NEEDLE SUT 2-0 SCORPION KNEE (NEEDLE) IMPLANT
NOVOSTICH PRO MENISCAL 2-0 (Miscellaneous) ×1 IMPLANT
NS IRRIG 1000ML POUR BTL (IV SOLUTION) ×1 IMPLANT
PACK ARTHROSCOPY DSU (CUSTOM PROCEDURE TRAY) ×1 IMPLANT
PAD ABD 8X10 STRL (GAUZE/BANDAGES/DRESSINGS) IMPLANT
PAD ARMBOARD 7.5X6 YLW CONV (MISCELLANEOUS) ×2 IMPLANT
PADDING CAST COTTON 6X4 STRL (CAST SUPPLIES) ×1 IMPLANT
PENCIL BUTTON HOLSTER BLD 10FT (ELECTRODE) ×1 IMPLANT
PORT APPOLLO RF 90DEGREE MULTI (SURGICAL WAND) IMPLANT
SOL PREP POV-IOD 4OZ 10% (MISCELLANEOUS) ×1 IMPLANT
SPONGE T-LAP 4X18 ~~LOC~~+RFID (SPONGE) ×1 IMPLANT
SUT ETHILON 3 0 PS 1 (SUTURE) IMPLANT
SUT FIBERWIRE 2-0 18 17.9 3/8 (SUTURE)
SUT MENISCAL KIT (KITS) IMPLANT
SUTURE FIBERWR 2-0 18 17.9 3/8 (SUTURE) IMPLANT
SYR 20ML ECCENTRIC (SYRINGE) ×1 IMPLANT
SYR CONTROL 10ML LL (SYRINGE) IMPLANT
SYR TB 1ML LUER SLIP (SYRINGE) ×1 IMPLANT
SYSTEM NVSTCH PRO MENISCAL 2-0 (Miscellaneous) IMPLANT
TOWEL GREEN STERILE (TOWEL DISPOSABLE) ×1 IMPLANT
TOWEL GREEN STERILE FF (TOWEL DISPOSABLE) ×1 IMPLANT
TUBE CONNECTING 12X1/4 (SUCTIONS) ×1 IMPLANT
TUBING ARTHROSCOPY IRRIG 16FT (MISCELLANEOUS) ×1 IMPLANT
WATER STERILE IRR 1000ML POUR (IV SOLUTION) ×1 IMPLANT

## 2022-06-04 NOTE — Anesthesia Preprocedure Evaluation (Addendum)
Anesthesia Evaluation  Patient identified by MRN, date of birth, ID band Patient awake    Reviewed: Allergy & Precautions, NPO status , Patient's Chart, lab work & pertinent test results  History of Anesthesia Complications (+) PONV and history of anesthetic complications  Airway Mallampati: I  TM Distance: >3 FB Neck ROM: Full    Dental  (+) Dental Advisory Given   Pulmonary neg shortness of breath, asthma (no recent flares) , neg sleep apnea, neg COPD, neg recent URI   Pulmonary exam normal breath sounds clear to auscultation       Cardiovascular (-) hypertension(-) angina (-) Past MI, (-) Cardiac Stents and (-) CABG + dysrhythmias (palpitations)  Rhythm:Regular Rate:Normal  Low-risk stress test 07/21/2018  TTE 07/21/2018: IMPRESSIONS     1. The left ventricle has normal systolic function with an ejection  fraction of 60-65%. The cavity size was normal. Left ventricular diastolic  parameters were normal.   2. The right ventricle has normal systolic function. The cavity was  normal. There is no increase in right ventricular wall thickness.   3. Mild thickening of the mitral valve leaflet.   4. The aortic valve is tricuspid. Mild thickening of the aortic valve.  Mild calcification of the aortic valve.     Neuro/Psych  Headaches, neg Seizures PSYCHIATRIC DISORDERS (ADHD)         GI/Hepatic negative GI ROS, Neg liver ROS,,,  Endo/Other  neg diabetesHypothyroidism    Renal/GU negative Renal ROS     Musculoskeletal  (+) Arthritis ,    Abdominal   Peds  Hematology negative hematology ROS (+)   Anesthesia Other Findings Last Mounjaro: 05/21/2022  Reproductive/Obstetrics                             Anesthesia Physical Anesthesia Plan  ASA: 2  Anesthesia Plan: General   Post-op Pain Management: Tylenol PO (pre-op)*   Induction: Intravenous  PONV Risk Score and Plan: 4 or greater and  Ondansetron, Dexamethasone, Propofol infusion, TIVA, Midazolam and Scopolamine patch - Pre-op  Airway Management Planned: LMA  Additional Equipment:   Intra-op Plan:   Post-operative Plan: Extubation in OR  Informed Consent: I have reviewed the patients History and Physical, chart, labs and discussed the procedure including the risks, benefits and alternatives for the proposed anesthesia with the patient or authorized representative who has indicated his/her understanding and acceptance.     Dental advisory given  Plan Discussed with: CRNA and Anesthesiologist  Anesthesia Plan Comments: (Risks of general anesthesia discussed including, but not limited to, sore throat, hoarse voice, chipped/damaged teeth, injury to vocal cords, nausea and vomiting, allergic reactions, lung infection, heart attack, stroke, and death. All questions answered. )       Anesthesia Quick Evaluation

## 2022-06-04 NOTE — Brief Op Note (Signed)
   Brief Op Note  Date of Surgery: 06/04/2022  Preoperative Diagnosis: LEFT MEDIAL MENISCUS TEAR  Postoperative Diagnosis: same  Procedure: Procedure(s): LEFT KNEE ARTHROSCOPY WITH MEDIAL MENISCAL REPAIR  Implants: Implant Name Type Inv. Item Serial No. Manufacturer Lot No. LRB No. Used Action  CARTRIDGE SUT 2-0 NONSTITCH - [id number] Anchor CARTRIDGE SUT 2-0 NONSTITCH  [employer] [id number] Left 1 Implanted  NOVOSTICH PRO MENISCAL 2-0 - [id number] Miscellaneous NOVOSTICH PRO MENISCAL 2-0  [employer] ENDOSCOPY [id number] Left 1 Implanted  CARTRIDGE SUT 2-0 NONSTITCH - [id number] Anchor CARTRIDGE SUT 2-0 NONSTITCH  [employer] ENDOSCOPY [id number] Left 1 Implanted    Surgeons: Surgeon(s): Huel Cote, MD  Anesthesia: Regional    Estimated Blood Loss: See anesthesia record  Complications: None  Condition to PACU: Stable  Benancio Deeds, MD 06/04/2022 2:23 PM

## 2022-06-04 NOTE — Transfer of Care (Signed)
Immediate Anesthesia Transfer of Care Note  Patient: Crystal Parks  Procedure(s) Performed: LEFT KNEE ARTHROSCOPY WITH MEDIAL MENISCAL REPAIR (Left: Knee)  Patient Location: PACU  Anesthesia Type:General  Level of Consciousness: drowsy and patient cooperative  Airway & Oxygen Therapy: Patient Spontanous Breathing  Post-op Assessment: Report given to RN and Post -op Vital signs reviewed and stable  Post vital signs: Reviewed and stable  Last Vitals:  Vitals Value Taken Time  BP 109/60 06/04/22 1426  Temp    Pulse 125 06/04/22 1429  Resp 17 06/04/22 1429  SpO2 100 % 06/04/22 1429  Vitals shown include unvalidated device data.  Last Pain:  Vitals:   06/04/22 1142  TempSrc:   PainSc: 0-No pain         Complications: There were no known notable events for this encounter.

## 2022-06-04 NOTE — Anesthesia Postprocedure Evaluation (Signed)
Anesthesia Post Note  Patient: Crystal Parks  Procedure(s) Performed: LEFT KNEE ARTHROSCOPY WITH MEDIAL MENISCAL REPAIR (Left: Knee)     Patient location during evaluation: PACU Anesthesia Type: General Level of consciousness: awake and alert Pain management: pain level controlled Vital Signs Assessment: post-procedure vital signs reviewed and stable Respiratory status: spontaneous breathing, nonlabored ventilation and respiratory function stable Cardiovascular status: blood pressure returned to baseline and stable Postop Assessment: no apparent nausea or vomiting Anesthetic complications: no   There were no known notable events for this encounter.  Last Vitals:  Vitals:   06/04/22 1500 06/04/22 1515  BP: (!) 101/55   Pulse: 88 92  Resp: 15 10  Temp:    SpO2: 98% 97%    Last Pain:  Vitals:   06/04/22 1456  TempSrc:   PainSc: 0-No pain                 Lowella Curb

## 2022-06-04 NOTE — Op Note (Signed)
Date of Surgery: 06/04/2022  INDICATIONS: Crystal Parks is a 47 y.o.-year-old female with left knee medial meniscal tear.  The risk and benefits of the procedure were discussed in detail and documented in the pre-operative evaluation.   PREOPERATIVE DIAGNOSIS: 1. Left knee medial meniscal tear  POSTOPERATIVE DIAGNOSIS: Same.  PROCEDURE: 1. Left knee medial meniscal repair  SURGEON: Benancio Deeds MD  ASSISTANT: Crystal Parks, ATC  ANESTHESIA:  general plus 20cc 0.25 marcaine intra-articular  IV FLUIDS AND URINE: See anesthesia record.  ANTIBIOTICS: Ancef  ESTIMATED BLOOD LOSS: 2 mL.  IMPLANTS:  Implant Name Type Inv. Item Serial No. Manufacturer Lot No. LRB No. Used Action  CARTRIDGE SUT 2-0 NONSTITCH - [id number] Anchor CARTRIDGE SUT 2-0 NONSTITCH  [employer] [id number] Left 1 Implanted  NOVOSTICH PRO MENISCAL 2-0 - [id number] Miscellaneous NOVOSTICH PRO MENISCAL 2-0  [employer] ENDOSCOPY [id number] Left 1 Implanted  CARTRIDGE SUT 2-0 NONSTITCH - [id number] Anchor CARTRIDGE SUT 2-0 NONSTITCH  [employer]COPY [id number] Left 1 Implanted    DRAINS: None  CULTURES: None  COMPLICATIONS: none  DESCRIPTION OF PROCEDURE:  Examination under anesthesia: A careful examination under anesthesia was performed.  Knee ROM motion was: -3-130 Lachman: Normal Pivot Shift: Normal Posterior drawer: normal.   Varus stability in full extension: normal.   Varus stability in 30 degrees of flexion: normal.  Valgus stability in full extension: normal.   Valgus stability in 30 degrees of flexion: normal.  Posterolateral drawer: normal   Intra-operative findings: A thorough arthroscopic examination of the knee was performed.  The findings are: 1. Suprapatellar pouch: Normal 2. Undersurface of median ridge: Normal 3. Medial patellar facet: Normal 4. Lateral patellar facet: Normal 5. Trochlea: Normal 6. Lateral gutter/popliteus tendon: Normal 7. Hoffa's fat pad:  Normal 8. Medial gutter/plica: Normal 9. ACL: Normal 10. PCL: Normal 11. Medial meniscus: Horizontal tear in the red-white zone of the mid third body 12. Medial compartment cartilage: Normal 13. Lateral meniscus: Normal 14. Lateral compartment cartilage: Normal   Examination under anesthesia: A careful examination under anesthesia was performed.  Knee ROM motion was: -3  - 130 Lachman: Grade 3B Pivot Shift: Positive Posterior drawer: normal Varus stability in full extension: normal Varus stability in 30 degrees of flexion: normal Valgus stability in full extension: normal Valgus stability in 30 degrees of flexion: normal Posterolateral drawer: normal   Intra-operative findings: A thorough arthroscopic examination of the knee was performed.  The findings are: 1. Suprapatellar pouch: Normal 2. Undersurface of median ridge: Normal 3. Medial patellar facet: Normal 4. Lateral patellar facet: Normal 5. Trochlea: Normal 6. Lateral gutter/popliteus tendon: Normal 7. Hoffa's fat pad: Normal 8. Medial gutter/plica: Normal 9. ACL: Complete tear 10. PCL: Normal 11. Medial meniscus: Longitudinal tear involving the peripheral rim of the posterior third 12. Medial compartment cartilage: Normal 13. Lateral meniscus: Tear obliquely at the posterior root 14. Lateral compartment cartilage: Normal      I identified the patient in the pre-operative holding area.  I marked the operative knee with my initials. I reviewed the risks and benefits of the proposed surgical intervention and the patient wished to proceed. Anesthesia was then performed with regional block.  The patient was transferred to the operative suite and placed in the supine position with all bony prominences padded.     SCDs were placed on the non-operative lower extremity. Appropriate antibiotics was administered within 1 hour before incision. The operative extremity was then prepped and draped in standard fashion. A time  out was  performed confirming the correct extremity, correct patient and correct procedure. A tourniquet was placed but not inflated.  Final timeout was performed. Arthroscopy portals were marked and portals were established with an 11 blade.  A diagnostic arthroscopy was performed with findings above. The femoral and tibial ACL footprints were debrided of tissue and prepared for tunnel placement.     At this time it was noted that there is also a medial meniscus longitudinal peripheral tear in the mid third  This was done in a hay bale fashion with a Katrinka Blazing and nephew Novo stitch.  This provided excellent apposition of the peripheral tear.  A total of 2 sutures were used  At this time the fluid was evacuated and 20 cc of 0.25% Marcaine were infiltrated into the knee.  Wounds were closed with 3-0 nylon.Xeroform, gauze, webril, and Ace wrap, Iceman were applied   Instrument, sponge, and needle counts were correct prior to wound closure and at the conclusion of the case.   The patient awoke from anesthesia without difficulty and was transferred to the PACU in stable condition.     POSTOPERATIVE PLAN: She will be weightbearing as tolerated.  I would like her to avoid no flexion beyond 90 degrees for the first 2 weeks.  She will be given crutches for assistance with weightbearing.  I will see her back in 2 weeks for suture removal  Benancio Deeds, MD 2:30 PM

## 2022-06-04 NOTE — Anesthesia Procedure Notes (Signed)
Procedure Name: LMA Insertion Date/Time: 06/04/2022 1:15 PM  Performed by: Gus Puma, CRNAPre-anesthesia Checklist: Patient identified, Emergency Drugs available, Suction available and Patient being monitored Patient Re-evaluated:Patient Re-evaluated prior to induction Oxygen Delivery Method: Circle System Utilized Preoxygenation: Pre-oxygenation with 100% oxygen Induction Type: IV induction Ventilation: Mask ventilation without difficulty LMA: LMA inserted LMA Size: 4.0 Number of attempts: 1 Placement Confirmation: positive ETCO2 Tube secured with: Tape Dental Injury: Teeth and Oropharynx as per pre-operative assessment

## 2022-06-04 NOTE — Discharge Instructions (Signed)
     Discharge Instructions    Attending Surgeon: Huel Cote, MD Office Phone Number: [phone]   Diagnosis and Procedures:    Surgeries Performed: Left knee medial meniscal repair  Discharge Plan:    Diet: Resume usual diet. Begin with light or bland foods.  Drink plenty of fluids.  Activity:  Weight bearing as tolerated left leg. You are advised to go home directly from the hospital or surgical center. Restrict your activities.  GENERAL INSTRUCTIONS: 1.  Please apply ice to your wound to help with swelling and inflammation. This will improve your comfort and your overall recovery following surgery.     2. Please call Dr. Serena Croissant office at [phone] with questions Monday-Friday during business hours. If no one answers, please leave a message and someone should get back to the patient within 24 hours. For emergencies please call 911 or proceed to the emergency room.   3. Patient to notify surgical team if experiences any of the following: Bowel/Bladder dysfunction, uncontrolled pain, nerve/muscle weakness, incision with increased drainage or redness, nausea/vomiting and Fever greater than 101.0 F.  Be alert for signs of infection including redness, streaking, odor, fever or chills. Be alert for excessive pain or bleeding and notify your surgeon immediately.  WOUND INSTRUCTIONS:   Leave your dressing, cast, or splint in place until your post operative visit.  Keep it clean and dry.  Always keep the incision clean and dry until the staples/sutures are removed. If there is no drainage from the incision you should keep it open to air. If there is drainage from the incision you must keep it covered at all times until the drainage stops  Do not soak in a bath tub, hot tub, pool, lake or other body of water until 21 days after your surgery and your incision is completely dry and healed.  If you have removable sutures (or staples) they must be removed 10-14 days (unless  otherwise instructed) from the day of your surgery.     1)  Elevate the extremity as much as possible.  2)  Keep the dressing clean and dry.  3)  Please call us if the dressing becomes wet or dirty.  4)  If you are experiencing worsening pain or worsening swelling, please call.     MEDICATIONS: Resume all previous home medications at the previous prescribed dose and frequency unless otherwise noted Start taking the  pain medications on an as-needed basis as prescribed  Please taper down pain medication over the next week following surgery.  Ideally you should not require a refill of any narcotic pain medication.  Take pain medication with food to minimize nausea. In addition to the prescribed pain medication, you may take over-the-counter pain relievers such as Tylenol.  Do NOT take additional tylenol if your pain medication already has tylenol in it.  Aspirin 325mg  daily for four weeks.      FOLLOWUP INSTRUCTIONS: 1. Follow up at the Physical Therapy Clinic 3-4 days following surgery. This appointment should be scheduled unless other arrangements have been made.The Physical Therapy scheduling number is [phone] if an appointment has not already been arranged.  2. Contact Dr. Serena Croissant office during office hours at [phone] or the practice after hours line at [phone] for non-emergencies. For medical emergencies call 911.   Discharge Location: Home

## 2022-06-04 NOTE — Interval H&P Note (Signed)
History and Physical Interval Note:  06/04/2022 12:55 PM  Crystal Parks  has presented today for surgery, with the diagnosis of LEFT MEDIAL MENISCUS TEAR.  The various methods of treatment have been discussed with the patient and family. After consideration of risks, benefits and other options for treatment, the patient has consented to  Procedure(s): LEFT KNEE ARTHROSCOPY WITH MEDIAL MENISCAL REPAIR (Left) as a surgical intervention.  The patient's history has been reviewed, patient examined, no change in status, stable for surgery.  I have reviewed the patient's chart and labs.  Questions were answered to the patient's satisfaction.     Huel Cote

## 2022-06-04 NOTE — Progress Notes (Signed)
Orthopedic Tech Progress Note Patient Details:  Crystal Parks May 11, 1975 [id number]  Ortho Devices Type of Ortho Device: Crutches Ortho Device/Splint Interventions: Ordered, Adjustment   Post Interventions Patient Tolerated: Well  Anadia Helmes A Bronc Brosseau 06/04/2022, 3:20 PM

## 2022-06-05 ENCOUNTER — Encounter (HOSPITAL_BASED_OUTPATIENT_CLINIC_OR_DEPARTMENT_OTHER): Payer: Self-pay | Admitting: Orthopaedic Surgery

## 2022-06-06 ENCOUNTER — Encounter (HOSPITAL_COMMUNITY): Payer: Self-pay | Admitting: Orthopaedic Surgery

## 2022-06-07 ENCOUNTER — Other Ambulatory Visit (HOSPITAL_BASED_OUTPATIENT_CLINIC_OR_DEPARTMENT_OTHER): Payer: Self-pay | Admitting: Orthopaedic Surgery

## 2022-06-07 ENCOUNTER — Other Ambulatory Visit: Payer: Self-pay

## 2022-06-07 ENCOUNTER — Encounter (HOSPITAL_BASED_OUTPATIENT_CLINIC_OR_DEPARTMENT_OTHER): Payer: Self-pay | Admitting: Physical Therapy

## 2022-06-07 ENCOUNTER — Ambulatory Visit (HOSPITAL_BASED_OUTPATIENT_CLINIC_OR_DEPARTMENT_OTHER): Payer: 59 | Attending: Orthopaedic Surgery | Admitting: Physical Therapy

## 2022-06-07 DIAGNOSIS — R262 Difficulty in walking, not elsewhere classified: Secondary | ICD-10-CM | POA: Diagnosis present

## 2022-06-07 DIAGNOSIS — S83242A Other tear of medial meniscus, current injury, left knee, initial encounter: Secondary | ICD-10-CM | POA: Diagnosis not present

## 2022-06-07 DIAGNOSIS — G8929 Other chronic pain: Secondary | ICD-10-CM

## 2022-06-07 DIAGNOSIS — M25562 Pain in left knee: Secondary | ICD-10-CM | POA: Insufficient documentation

## 2022-06-07 NOTE — Therapy (Signed)
OUTPATIENT PHYSICAL THERAPY EVALUATION   Patient Name: Crystal Parks MRN: [id number] DOB:[birth year], 47 y.o., female Today's Date: 06/07/2022  END OF SESSION:  PT End of Session - 06/07/22 1155     Visit Number 1    Number of Visits 17    Date for PT Re-Evaluation 08/03/22    Authorization Type [employer]    PT Start Time 1148    PT Stop Time 1230    PT Time Calculation (min) 42 min    Activity Tolerance Patient tolerated treatment well    Behavior During Therapy WFL for tasks assessed/performed             Past Medical History:  Diagnosis Date   ADHD (attention deficit hyperactivity disorder)    Arthritis    bilateral hands/fingers   Complication of anesthesia    Woke up extremely cold after anesthesia. Required warm blankets.   Headache    Migraine   Hypothyroidism    Thyroid disease    Past Surgical History:  Procedure Laterality Date   ceas     c-section   KNEE ARTHROSCOPY WITH MENISCAL REPAIR Left 06/04/2022   Procedure: LEFT KNEE ARTHROSCOPY WITH MEDIAL MENISCAL REPAIR;  Surgeon: Huel Cote, MD;  Location: [facility] OR;  Service: Orthopedics;  Laterality: Left;   REDUCTION MAMMAPLASTY Bilateral    age 89   WISDOM TOOTH EXTRACTION     Patient Active Problem List   Diagnosis Date Noted   Acute medial meniscus tear of left knee 06/04/2022   Inappropriate sinus tachycardia 06/30/2018   Palpitations 06/30/2018   Acute cystitis with hematuria 11/21/2016   Hypothyroidism 11/29/2015   Impaired fasting glucose 11/29/2015   Myalgia 11/29/2015   Female pattern alopecia 11/15/2011   Telogen effluvium 11/15/2011   ALLERGIC RHINITIS 10/18/2009   ASTHMA 10/18/2009   Asthma 10/18/2009   REFERRING PROVIDER: Steward Drone, MD  REFERRING DIAG:  S83.242A (ICD-10-CM) - Acute medial meniscus tear of left knee, initial encounter    Post op Lt medial meniscus repair  Rationale for Evaluation and Treatment: Rehabilitation  THERAPY DIAG:  Acute pain of left knee  Difficulty in  walking, not elsewhere classified  ONSET DATE: DOS 06/04/22   SUBJECTIVE:                                                                                                                                                                                           SUBJECTIVE STATEMENT: The only pain is when I move my leg from elevated to angling down.   PERTINENT HISTORY:  hypothyroidism  PAIN:  Are you having pain? Yes: NPRS scale: 0 to 3/10 Pain location: Lt  knee, mostly post Pain description: stiff, sore Aggravating factors: moving from straight to angled Relieving factors: ice, walking improved quick pain after moving  PRECAUTIONS: None  WEIGHT BEARING RESTRICTIONS: Yes POSTOPERATIVE PLAN: She will be weightbearing as tolerated.  I would like her to avoid no flexion beyond 90 degrees for the first 2 weeks.  She will be given crutches for assistance with weightbearing.  I will see her back in 2 weeks for suture removal  FALLS:  Has patient fallen in last 6 months? Yes. Number of falls 1  LIVING ENVIRONMENT: There is a finished basement but only go down there if doing laundry  OCCUPATION: mental health therapist  PLOF: Independent  PATIENT GOALS: grappling   OBJECTIVE:   PATIENT SURVEYS:  FOTO EVAL: 29  COGNITIVE STATUS: Within functional limits for tasks assessed   RED FLAGS: None    SENSATION: WFL  POSTURE:  No Significant postural limitations  GAIT: EVAL: bil Axillary crutches   Body Part #1 Knee  PALPATION: EVAL: no s/s of infection, minimal edema  LE ROM LOWER EXTREMITY ROM:     Active   Left eval  Hip flexion    Hip extension    Hip abduction    Hip adduction    Hip internal rotation    Hip external rotation    Knee flexion  84  Knee extension  0  Ankle dorsiflexion    Ankle plantarflexion    Ankle inversion    Ankle eversion     (Blank rows = not tested)    TREATMENT:                                                                                                                                DATE: EVAL 06/07/22     PATIENT EDUCATION:  Education details: Anatomy of condition, POC, HEP, exercise form/rationale Person educated: Patient and Spouse Education method: Explanation, Demonstration, Tactile cues, Verbal cues, and Handouts Education comprehension: verbalized understanding, returned demonstration, verbal cues required, tactile cues required, and needs further education  HOME EXERCISE PROGRAM: [id number]    ASSESSMENT:  CLINICAL IMPRESSION: Patient is a 47 y.o. F who was seen today for physical therapy evaluation and treatment for s/p Lt medial meniscus repair.    REHAB POTENTIAL: Good  CLINICAL DECISION MAKING: Stable/uncomplicated  EVALUATION COMPLEXITY: Low   GOALS:  SHORT TERM GOALS: Target date: 5/31  Gait pattern WFL without AD, pain <2/10 Baseline: WBAT with bil axillary crutches at eval Goal status: INITIAL  2.  Knee ROM 0-120 Baseline: limit to 90 in first 2 weeks Goal status: INITIAL  3.  SLS with proximal control for level pelvis Baseline: not appropriate to test at eval Goal status: INITIAL   LONG TERM GOALS: Target date: POC date  Meet FOTO goal Baseline:  Goal status: INITIAL  2.  Lunges and stairs with level pelvis and LE control without pain Baseline:  Goal status: INITIAL  3.  Good  tolerance to repetitive motion on bike for at least 10 min without pain Baseline:  Goal status: INITIAL  4.  Extend POC Baseline: current POC to 8 weeks- pt wants to return to a very high level of function, futher goals to be set at this POC date  Goal status: INITIAL    PLAN:  PT FREQUENCY: 1-2x/week  PT DURATION: 8 weeks  PLANNED INTERVENTIONS: Therapeutic exercises, Therapeutic activity, Neuromuscular re-education, Balance training, Gait training, Patient/Family education, Self Care, Joint mobilization, Stair training, Aquatic Therapy, Dry Needling, Electrical stimulation,  Spinal mobilization, Cryotherapy, Moist heat, scar mobilization, Taping, Ultrasound, Ionotophoresis 4mg /ml Dexamethasone, Manual therapy, and Re-evaluation.  PLAN FOR NEXT SESSION: per protocol   Crystal Parks Crystal Parks PT, DPT 06/07/22 12:53 PM

## 2022-06-11 ENCOUNTER — Encounter (HOSPITAL_BASED_OUTPATIENT_CLINIC_OR_DEPARTMENT_OTHER): Payer: Self-pay | Admitting: Physical Therapy

## 2022-06-13 ENCOUNTER — Ambulatory Visit (HOSPITAL_BASED_OUTPATIENT_CLINIC_OR_DEPARTMENT_OTHER): Payer: 59 | Admitting: Physical Therapy

## 2022-06-13 ENCOUNTER — Encounter (HOSPITAL_BASED_OUTPATIENT_CLINIC_OR_DEPARTMENT_OTHER): Payer: Self-pay | Admitting: Physical Therapy

## 2022-06-13 DIAGNOSIS — R262 Difficulty in walking, not elsewhere classified: Secondary | ICD-10-CM

## 2022-06-13 DIAGNOSIS — M25562 Pain in left knee: Secondary | ICD-10-CM | POA: Diagnosis not present

## 2022-06-13 NOTE — Therapy (Signed)
OUTPATIENT PHYSICAL THERAPY TREATMENT   Patient Name: Crystal Parks MRN: [id number] DOB:1975/06/12, 47 y.o., female Today's Date: 06/13/2022  END OF SESSION:  PT End of Session - 06/13/22 1050     Visit Number 2    Number of Visits 17    Date for PT Re-Evaluation 08/03/22    Authorization Type [employer]    PT Start Time 1016    PT Stop Time 1100    PT Time Calculation (min) 44 min    Activity Tolerance Patient tolerated treatment well    Behavior During Therapy WFL for tasks assessed/performed              Past Medical History:  Diagnosis Date   ADHD (attention deficit hyperactivity disorder)    Arthritis    bilateral hands/fingers   Complication of anesthesia    Woke up extremely cold after anesthesia. Required warm blankets.   Headache    Migraine   Hypothyroidism    Thyroid disease    Past Surgical History:  Procedure Laterality Date   ceas     c-section   KNEE ARTHROSCOPY WITH MENISCAL REPAIR Left 06/04/2022   Procedure: LEFT KNEE ARTHROSCOPY WITH MEDIAL MENISCAL REPAIR;  Surgeon: Huel Cote, MD;  Location: [facility] OR;  Service: Orthopedics;  Laterality: Left;   REDUCTION MAMMAPLASTY Bilateral    age 83   WISDOM TOOTH EXTRACTION     Patient Active Problem List   Diagnosis Date Noted   Acute medial meniscus tear of left knee 06/04/2022   Inappropriate sinus tachycardia 06/30/2018   Palpitations 06/30/2018   Acute cystitis with hematuria 11/21/2016   Hypothyroidism 11/29/2015   Impaired fasting glucose 11/29/2015   Myalgia 11/29/2015   Female pattern alopecia 11/15/2011   Telogen effluvium 11/15/2011   ALLERGIC RHINITIS 10/18/2009   ASTHMA 10/18/2009   Asthma 10/18/2009   REFERRING PROVIDER: Steward Drone, MD  REFERRING DIAG:  S83.242A (ICD-10-CM) - Acute medial meniscus tear of left knee, initial encounter    Post op Lt medial meniscus repair  Rationale for Evaluation and Treatment: Rehabilitation  THERAPY DIAG:  Acute pain of left knee  Difficulty  in walking, not elsewhere classified  ONSET DATE: DOS 06/04/22  Days since surgery: 9   SUBJECTIVE:                                                                                                                                                                                           SUBJECTIVE STATEMENT: Pt states there is no pain. Pt states there are aches along the line of the repair and along the inferior patellar. Pt notes a "squelching" noise with self massage.  Pt report difficulty with remaining under 1k steps a day. Pt does state she has used a single crutch at home.   PERTINENT HISTORY:  hypothyroidism  PAIN:  Are you having pain? Yes: NPRS scale: 2/10 Pain location: Lt knee, mostly post Pain description: stiff, sore Aggravating factors: moving from straight to angled Relieving factors: ice, walking improved quick pain after moving  PRECAUTIONS: None  WEIGHT BEARING RESTRICTIONS: Yes POSTOPERATIVE PLAN: She will be weightbearing as tolerated.  I would like her to avoid no flexion beyond 90 degrees for the first 2 weeks.  She will be given crutches for assistance with weightbearing.  I will see her back in 2 weeks for suture removal  FALLS:  Has patient fallen in last 6 months? Yes. Number of falls 1  LIVING ENVIRONMENT: There is a finished basement but only go down there if doing laundry  OCCUPATION: mental health therapist  PLOF: Independent  PATIENT GOALS: grappling   OBJECTIVE:   PATIENT SURVEYS:  FOTO EVAL: 29  Active   Left 5/15  Hip flexion    Hip extension    Hip abduction    Hip adduction    Hip internal rotation    Hip external rotation    Knee flexion  90  Knee extension  0  Ankle dorsiflexion    Ankle plantarflexion    Ankle inversion    Ankle eversion     (Blank rows = not tested)    TREATMENT:                                                                                                                               DATE:  5/15  Quad set with prop   [place] E-stim with SLR; 52 mA, 10/10 on off, 5s ramp, 10 min treament; 50% duty cycle Cathode at [employer], anode at proximal rec fem  NMES unit discussed for home and usage of [place] setting as well as muscle pump setting for edema management  Exercises - Seated Ankle Pumps on Table  - 10 x daily - 7 x weekly - 1 sets - 10 reps - Supine Heel Slide  - 5 x daily - 7 x weekly - 1 sets - 5 reps - Supine Active Straight Leg Raise  - 3 x daily - 7 x weekly - 5 sets - 5 reps - Sidelying Hip Abduction  - 3 x daily - 7 x weekly - 3 sets - 10 reps - Sidelying Hip Circles  - 3 x daily - 7 x weekly - 4 sets - 10 reps - Prone Hip Extension with Plantarflexion  - 3 x daily - 7 x weekly - 2 sets - 10 reps    PATIENT EDUCATION:  Education details: surgical precautions, anatomy, DOMS expectations, muscle firing, HEP, POC  Person educated: Patient and Spouse Education method: Explanation, Demonstration, Tactile cues, Verbal cues, and Handouts Education comprehension: verbalized understanding, returned demonstration, verbal cues required, tactile cues required,  and needs further education  HOME EXERCISE PROGRAM: [phone]    ASSESSMENT:  CLINICAL IMPRESSION: Pt session focused on quadriceps activation, edu regarding end range of 90 deg flexion, HEP modifications, and surgical protection. Pt very motivated to return to activity but advised that current phase of healing is protection of the repair site and progressively progressing WB to avoid excessive inflammatory response and pain. Pt gave verbal understanding to edu. Pt was able to reach full SLR without lag with [place] NMES assist. Plan to continue per protocol with controlled ROM, quad activation, and progressive hip and hamstring loading. Consider partial range LAQ from 90-60 as well as a prone TKE. Pt would benefit from continued skilled therapy in order to reach goals and maximize functional L LE strength and ROM for full  return to PLOF.   REHAB POTENTIAL: Good  CLINICAL DECISION MAKING: Stable/uncomplicated  EVALUATION COMPLEXITY: Low   GOALS:  SHORT TERM GOALS: Target date: 5/31  Gait pattern WFL without AD, pain <2/10 Baseline: WBAT with bil axillary crutches at eval Goal status: INITIAL  2.  Knee ROM 0-120 Baseline: limit to 90 in first 2 weeks Goal status: INITIAL  3.  SLS with proximal control for level pelvis Baseline: not appropriate to test at eval Goal status: INITIAL   LONG TERM GOALS: Target date: POC date  Meet FOTO goal Baseline:  Goal status: INITIAL  2.  Lunges and stairs with level pelvis and LE control without pain Baseline:  Goal status: INITIAL  3.  Good tolerance to repetitive motion on bike for at least 10 min without pain Baseline:  Goal status: INITIAL  4.  Extend POC Baseline: current POC to 8 weeks- pt wants to return to a very high level of function, futher goals to be set at this POC date  Goal status: INITIAL    PLAN:  PT FREQUENCY: 1-2x/week  PT DURATION: 8 weeks  PLANNED INTERVENTIONS: Therapeutic exercises, Therapeutic activity, Neuromuscular re-education, Balance training, Gait training, Patient/Family education, Self Care, Joint mobilization, Stair training, Aquatic Therapy, Dry Needling, Electrical stimulation, Spinal mobilization, Cryotherapy, Moist heat, scar mobilization, Taping, Ultrasound, Ionotophoresis 4mg /ml Dexamethasone, Manual therapy, and Re-evaluation.  PLAN FOR NEXT SESSION: per protocol   Zebedee Iba PT, DPT 06/13/22 1:03 PM

## 2022-06-18 ENCOUNTER — Ambulatory Visit (HOSPITAL_BASED_OUTPATIENT_CLINIC_OR_DEPARTMENT_OTHER): Payer: 59 | Admitting: Physical Therapy

## 2022-06-18 DIAGNOSIS — M25562 Pain in left knee: Secondary | ICD-10-CM | POA: Diagnosis not present

## 2022-06-18 DIAGNOSIS — R262 Difficulty in walking, not elsewhere classified: Secondary | ICD-10-CM

## 2022-06-18 NOTE — Therapy (Signed)
OUTPATIENT PHYSICAL THERAPY TREATMENT   Patient Name: Crystal Parks MRN: [id number] DOB:1975/06/12, 47 y.o., female Today's Date: 06/13/2022  END OF SESSION:  PT End of Session - 06/13/22 1050     Visit Number 2    Number of Visits 17    Date for PT Re-Evaluation 08/03/22    Authorization Type [employer]    PT Start Time 1016    PT Stop Time 1100    PT Time Calculation (min) 44 min    Activity Tolerance Patient tolerated treatment well    Behavior During Therapy WFL for tasks assessed/performed              Past Medical History:  Diagnosis Date   ADHD (attention deficit hyperactivity disorder)    Arthritis    bilateral hands/fingers   Complication of anesthesia    Woke up extremely cold after anesthesia. Required warm blankets.   Headache    Migraine   Hypothyroidism    Thyroid disease    Past Surgical History:  Procedure Laterality Date   ceas     c-section   KNEE ARTHROSCOPY WITH MENISCAL REPAIR Left 06/04/2022   Procedure: LEFT KNEE ARTHROSCOPY WITH MEDIAL MENISCAL REPAIR;  Surgeon: Huel Cote, MD;  Location: [facility] OR;  Service: Orthopedics;  Laterality: Left;   REDUCTION MAMMAPLASTY Bilateral    age 83   WISDOM TOOTH EXTRACTION     Patient Active Problem List   Diagnosis Date Noted   Acute medial meniscus tear of left knee 06/04/2022   Inappropriate sinus tachycardia 06/30/2018   Palpitations 06/30/2018   Acute cystitis with hematuria 11/21/2016   Hypothyroidism 11/29/2015   Impaired fasting glucose 11/29/2015   Myalgia 11/29/2015   Female pattern alopecia 11/15/2011   Telogen effluvium 11/15/2011   ALLERGIC RHINITIS 10/18/2009   ASTHMA 10/18/2009   Asthma 10/18/2009   REFERRING PROVIDER: Steward Drone, MD  REFERRING DIAG:  S83.242A (ICD-10-CM) - Acute medial meniscus tear of left knee, initial encounter    Post op Lt medial meniscus repair  Rationale for Evaluation and Treatment: Rehabilitation  THERAPY DIAG:  Acute pain of left knee  Difficulty  in walking, not elsewhere classified  ONSET DATE: DOS 06/04/22  Days since surgery: 9   SUBJECTIVE:                                                                                                                                                                                           SUBJECTIVE STATEMENT: Pt states there is no pain. Pt states there are aches along the line of the repair and along the inferior patellar. Pt notes a "squelching" noise with self massage.  Pt report difficulty with remaining under 1k steps a day. Pt does state she has used a single crutch at home.   PERTINENT HISTORY:  hypothyroidism  PAIN:  Are you having pain? Yes: NPRS scale: 2/10 Pain location: Lt knee, mostly post Pain description: stiff, sore Aggravating factors: moving from straight to angled Relieving factors: ice, walking improved quick pain after moving  PRECAUTIONS: None  WEIGHT BEARING RESTRICTIONS: Yes POSTOPERATIVE PLAN: She will be weightbearing as tolerated.  I would like her to avoid no flexion beyond 90 degrees for the first 2 weeks.  She will be given crutches for assistance with weightbearing.  I will see her back in 2 weeks for suture removal  FALLS:  Has patient fallen in last 6 months? Yes. Number of falls 1  LIVING ENVIRONMENT: There is a finished basement but only go down there if doing laundry  OCCUPATION: mental health therapist  PLOF: Independent  PATIENT GOALS: grappling   OBJECTIVE:   PATIENT SURVEYS:  FOTO EVAL: 29  Active   Left 5/15  Hip flexion    Hip extension    Hip abduction    Hip adduction    Hip internal rotation    Hip external rotation    Knee flexion  90  Knee extension  0  Ankle dorsiflexion    Ankle plantarflexion    Ankle inversion    Ankle eversion     (Blank rows = not tested)    TREATMENT:                                                                                                                               DATE:  5/15  Quad set with prop   [place] E-stim with SLR; 52 mA, 10/10 on off, 5s ramp, 10 min treament; 50% duty cycle Cathode at [employer], anode at proximal rec fem  NMES unit discussed for home and usage of [place] setting as well as muscle pump setting for edema management  Exercises - Seated Ankle Pumps on Table  - 10 x daily - 7 x weekly - 1 sets - 10 reps - Supine Heel Slide  - 5 x daily - 7 x weekly - 1 sets - 5 reps - Supine Active Straight Leg Raise  - 3 x daily - 7 x weekly - 5 sets - 5 reps - Sidelying Hip Abduction  - 3 x daily - 7 x weekly - 3 sets - 10 reps - Sidelying Hip Circles  - 3 x daily - 7 x weekly - 4 sets - 10 reps - Prone Hip Extension with Plantarflexion  - 3 x daily - 7 x weekly - 2 sets - 10 reps    PATIENT EDUCATION:  Education details: surgical precautions, anatomy, DOMS expectations, muscle firing, HEP, POC  Person educated: Patient and Spouse Education method: Explanation, Demonstration, Tactile cues, Verbal cues, and Handouts Education comprehension: verbalized understanding, returned demonstration, verbal cues required, tactile cues required,  and needs further education  HOME EXERCISE PROGRAM: [phone]    ASSESSMENT:  CLINICAL IMPRESSION: Pt session focused on quadriceps activation, edu regarding end range of 90 deg flexion, HEP modifications, and surgical protection. Pt very motivated to return to activity but advised that current phase of healing is protection of the repair site and progressively progressing WB to avoid excessive inflammatory response and pain. Pt gave verbal understanding to edu. Pt was able to reach full SLR without lag with [place] NMES assist. Plan to continue per protocol with controlled ROM, quad activation, and progressive hip and hamstring loading. Consider partial range LAQ from 90-60 as well as a prone TKE. Pt would benefit from continued skilled therapy in order to reach goals and maximize functional L LE strength and ROM for full  return to PLOF.   REHAB POTENTIAL: Good  CLINICAL DECISION MAKING: Stable/uncomplicated  EVALUATION COMPLEXITY: Low   GOALS:  SHORT TERM GOALS: Target date: 5/31  Gait pattern WFL without AD, pain <2/10 Baseline: WBAT with bil axillary crutches at eval Goal status: INITIAL  2.  Knee ROM 0-120 Baseline: limit to 90 in first 2 weeks Goal status: INITIAL  3.  SLS with proximal control for level pelvis Baseline: not appropriate to test at eval Goal status: INITIAL   LONG TERM GOALS: Target date: POC date  Meet FOTO goal Baseline:  Goal status: INITIAL  2.  Lunges and stairs with level pelvis and LE control without pain Baseline:  Goal status: INITIAL  3.  Good tolerance to repetitive motion on bike for at least 10 min without pain Baseline:  Goal status: INITIAL  4.  Extend POC Baseline: current POC to 8 weeks- pt wants to return to a very high level of function, futher goals to be set at this POC date  Goal status: INITIAL    PLAN:  PT FREQUENCY: 1-2x/week  PT DURATION: 8 weeks  PLANNED INTERVENTIONS: Therapeutic exercises, Therapeutic activity, Neuromuscular re-education, Balance training, Gait training, Patient/Family education, Self Care, Joint mobilization, Stair training, Aquatic Therapy, Dry Needling, Electrical stimulation, Spinal mobilization, Cryotherapy, Moist heat, scar mobilization, Taping, Ultrasound, Ionotophoresis 4mg /ml Dexamethasone, Manual therapy, and Re-evaluation.  PLAN FOR NEXT SESSION: per protocol   Zebedee Iba PT, DPT 06/13/22 1:03 PM

## 2022-06-19 ENCOUNTER — Encounter (HOSPITAL_BASED_OUTPATIENT_CLINIC_OR_DEPARTMENT_OTHER): Payer: Self-pay | Admitting: Physical Therapy

## 2022-06-20 ENCOUNTER — Ambulatory Visit (INDEPENDENT_AMBULATORY_CARE_PROVIDER_SITE_OTHER): Payer: 59 | Admitting: Orthopaedic Surgery

## 2022-06-20 DIAGNOSIS — S83242A Other tear of medial meniscus, current injury, left knee, initial encounter: Secondary | ICD-10-CM

## 2022-06-20 NOTE — Progress Notes (Signed)
Post Operative Evaluation    Procedure/Date of Surgery: Left knee medial meniscal repair 2 weeks prior  Interval History:    Presents today 2 weeks status post left knee medial meniscal repair over doing well.  She has been compliant with nonweightbearing.  She does have some soreness about the bone medially.  Overall she is doing quite well.  PMH/PSH/Family History/Social History/Meds/Allergies:    Past Medical History:  Diagnosis Date   ADHD (attention deficit hyperactivity disorder)    Arthritis    bilateral hands/fingers   Complication of anesthesia    Woke up extremely cold after anesthesia. Required warm blankets.   Headache    Migraine   Hypothyroidism    Thyroid disease    Past Surgical History:  Procedure Laterality Date   ceas     c-section   KNEE ARTHROSCOPY WITH MENISCAL REPAIR Left 06/04/2022   Procedure: LEFT KNEE ARTHROSCOPY WITH MEDIAL MENISCAL REPAIR;  Surgeon: Huel Cote, MD;  Location: [facility] OR;  Service: Orthopedics;  Laterality: Left;   REDUCTION MAMMAPLASTY Bilateral    age 47   WISDOM TOOTH EXTRACTION     Social History   Socioeconomic History   Marital status: Married    Spouse name: Not on file   Number of children: Not on file   Years of education: Not on file   Highest education level: Not on file  Occupational History   Not on file  Tobacco Use   Smoking status: Never   Smokeless tobacco: Never  Substance and Sexual Activity   Alcohol use: Yes    Comment: occasionally   Drug use: No   Sexual activity: Yes  Other Topics Concern   Not on file  Social History Narrative   Not on file   Social Determinants of Health   Financial Resource Strain: Not on file  Food Insecurity: Not on file  Transportation Needs: Not on file  Physical Activity: Not on file  Stress: Not on file  Social Connections: Not on file   No family history on file. Allergies  Allergen Reactions   Food Other (See Comments)     Eggplant-hives/itching/oral swelling   Ciprofloxacin Itching, Nausea And Vomiting and Rash    Hallucinations/fever   Current Outpatient Medications  Medication Sig Dispense Refill   ALPRAZolam (XANAX) 0.5 MG tablet Take 0.5 mg by mouth daily as needed for anxiety.     aspirin EC 325 MG tablet Take 1 tablet (325 mg total) by mouth daily. 14 tablet 0   COLLAGEN PO Take 1 tablet by mouth daily in the afternoon. (Gummy)     levocetirizine (XYZAL) 5 MG tablet Take 5 mg by mouth daily in the afternoon.     MAGNESIUM PO Take 1 tablet by mouth in the morning and at bedtime. (Gummy)     MOUNJARO 2.5 MG/0.5ML Pen Inject 2.5 mg into the skin every Monday.     NP THYROID 15 MG tablet Take 15 mg by mouth See admin instructions. 15 mg + 60 mg=75 mg (take 75 mg dose by mouth every other day--alternating with 90 mg dosage)     NP THYROID 90 MG tablet Take 90 mg by mouth every other day. (Take 90 mg dose by mouth every other day--alternating with 75 mg dosage)     oxyCODONE (ROXICODONE) 5 MG immediate release  tablet Take 1 tablet (5 mg total) by mouth every 4 (four) hours as needed for severe pain or breakthrough pain. 5 tablet 0   thyroid (ARMOUR) 60 MG tablet Take 60 mg by mouth See admin instructions. 60 mg + 15 mg=75 mg (take 75 mg dose by mouth every other day--alternating with 90 mg dosage)     Triamcinolone Acetonide (NASACORT AQ NA) Place 1 spray into the nose every evening.     No current facility-administered medications for this visit.   No results found.  Review of Systems:   A ROS was performed including pertinent positives and negatives as documented in the HPI.   Musculoskeletal Exam:      Left knee incisions are well-appearing without erythema or drainage.  There is mild quad atrophy.  There is some difficulty with activation of the quad.  She lacks approximately 3 degrees of full hyperextension with 90 degrees of flexion no joint line tenderness.  Imaging:      I personally  reviewed and interpreted the radiographs.   Assessment:   2 weeks status post left knee medial meniscal repair overall doing well.  At this time she will progress her range of motion activity as tolerated.  I will plan to see her back in 4 weeks for reassessment  Plan :    -Return to clinic 4 weeks for reassessment      I personally saw and evaluated the patient, and participated in the management and treatment plan.  Huel Cote, MD Attending Physician, Orthopedic Surgery  This document was dictated using Dragon voice recognition software. A reasonable attempt at proof reading has been made to minimize errors.

## 2022-06-23 ENCOUNTER — Encounter (HOSPITAL_BASED_OUTPATIENT_CLINIC_OR_DEPARTMENT_OTHER): Payer: Self-pay

## 2022-06-26 ENCOUNTER — Encounter (HOSPITAL_BASED_OUTPATIENT_CLINIC_OR_DEPARTMENT_OTHER): Payer: Self-pay | Admitting: Physical Therapy

## 2022-06-27 ENCOUNTER — Encounter (HOSPITAL_BASED_OUTPATIENT_CLINIC_OR_DEPARTMENT_OTHER): Payer: Self-pay

## 2022-06-27 ENCOUNTER — Ambulatory Visit (HOSPITAL_BASED_OUTPATIENT_CLINIC_OR_DEPARTMENT_OTHER): Payer: 59 | Admitting: Physical Therapy

## 2022-06-27 ENCOUNTER — Encounter (HOSPITAL_BASED_OUTPATIENT_CLINIC_OR_DEPARTMENT_OTHER): Payer: Self-pay | Admitting: Physical Therapy

## 2022-06-27 DIAGNOSIS — M25562 Pain in left knee: Secondary | ICD-10-CM

## 2022-06-27 DIAGNOSIS — R262 Difficulty in walking, not elsewhere classified: Secondary | ICD-10-CM

## 2022-06-27 NOTE — Therapy (Signed)
OUTPATIENT PHYSICAL THERAPY TREATMENT   Patient Name: Crystal Parks MRN: [id number] DOB:04-02-1975, 47 y.o., female Today's Date: 06/19/2022  END OF SESSION:  PT End of Session - 06/19/22 0949     Visit Number 3    Number of Visits 17    Date for PT Re-Evaluation 08/03/22    Authorization Type U[employer]    PT Start Time 1235    PT Stop Time 1315    PT Time Calculation (min) 40 min    Activity Tolerance Patient tolerated treatment well    Behavior During Therapy WFL for tasks assessed/performed               Past Medical History:  Diagnosis Date   ADHD (attention deficit hyperactivity disorder)    Arthritis    bilateral hands/fingers   Complication of anesthesia    Woke up extremely cold after anesthesia. Required warm blankets.   Headache    Migraine   Hypothyroidism    Thyroid disease    Past Surgical History:  Procedure Laterality Date   ceas     c-section   KNEE ARTHROSCOPY WITH MENISCAL REPAIR Left 06/04/2022   Procedure: LEFT KNEE ARTHROSCOPY WITH MEDIAL MENISCAL REPAIR;  Surgeon: Huel Cote, MD;  Location: [facility] OR;  Service: Orthopedics;  Laterality: Left;   REDUCTION MAMMAPLASTY Bilateral    age 47   WISDOM TOOTH EXTRACTION     Patient Active Problem List   Diagnosis Date Noted   Acute medial meniscus tear of left knee 06/04/2022   Inappropriate sinus tachycardia 06/30/2018   Palpitations 06/30/2018   Acute cystitis with hematuria 11/21/2016   Hypothyroidism 11/29/2015   Impaired fasting glucose 11/29/2015   Myalgia 11/29/2015   Female pattern alopecia 11/15/2011   Telogen effluvium 11/15/2011   ALLERGIC RHINITIS 10/18/2009   ASTHMA 10/18/2009   Asthma 10/18/2009   REFERRING PROVIDER: Steward Drone, MD  REFERRING DIAG:  S83.242A (ICD-10-CM) - Acute medial meniscus tear of left knee, initial encounter    Post op Lt medial meniscus repair  Rationale for Evaluation and Treatment: Rehabilitation  THERAPY DIAG:  Acute pain of left knee  Difficulty  in walking, not elsewhere classified  ONSET DATE: DOS 06/04/22  Days since surgery: 23    SUBJECTIVE:                                                                                                                                                                                           SUBJECTIVE STATEMENT: The patient reports mild pain this morning. She has been working on her exercises and has been working with 1 crutch at home. Her pain has no been  higher then a 2.   PERTINENT HISTORY:  hypothyroidism  PAIN:  Are you having pain? Yes: NPRS scale: 2/10 Pain location: Lt knee, mostly post Pain description: stiff, sore Aggravating factors: moving from straight to angled Relieving factors: ice, walking improved quick pain after moving  PRECAUTIONS: None  WEIGHT BEARING RESTRICTIONS: Yes POSTOPERATIVE PLAN: She will be weightbearing as tolerated.  I would like her to avoid no flexion beyond 90 degrees for the first 2 weeks.  She will be given crutches for assistance with weightbearing.  I will see her back in 2 weeks for suture removal  FALLS:  Has patient fallen in last 6 months? Yes. Number of falls 1  LIVING ENVIRONMENT: There is a finished basement but only go down there if doing laundry  OCCUPATION: mental health therapist  PLOF: Independent  PATIENT GOALS: grappling   OBJECTIVE:   PATIENT SURVEYS:  FOTO EVAL: 29  Active   Left 5/15 Left  5/29  Hip flexion     Hip extension     Hip abduction     Hip adduction     Hip internal rotation     Hip external rotation     Knee flexion  90 113  Knee extension  0 -3  Ankle dorsiflexion     Ankle plantarflexion     Ankle inversion     Ankle eversion      (Blank rows = not tested)    TREATMENT:                                                                                                                              5/29  Manual: PROM into flexion and extension; Trigger point release to IT band and posterior  Knee   Nu-step 6 min L2 for ROM   SLR x20 1.5 lbs RPE of 6   SL SLR  2x10 1.5   Supine extension 2x10    Heel raise 2x20   TKE 3x10 red      5/20  E-stim unattended: Patient brought in her own NMES unit.  Therapy showed patient different settings to use at home.  Therapy set patient up on the unit.  She was able to achieve muscle contraction 30 mg comfortably.  We educated the patient on pad where and when to exchange pads.  We also educated patient on progression of exercises.  The setting that she was to use over 10 seconds on the off period.   Gait training: In anticipation of patient's visit with Dr. Wednesday we talked to her about 50% weightbearing using crutches we talked her about progressive weightbearing on crutches as allowed by MD.  She sees the MD tomorrow but then does not see physical therapy till the next week we talked her about safe progression of weightbearing if allowed by the MD.  She was advised that not allowed by the MD not to progress until she is cleared  Straight leg raise x  30 Side-lying straight leg raise x 30 Side-lying hip extension x 30 all with RPE of 2 will load when allowed by protocol  Manual passive range of motion into flexion and extension.  Comfortably past protocol limits so limited manual performed today   DATE: 5/15  Quad set with prop   [place] E-stim with SLR; 52 mA, 10/10 on off, 5s ramp, 10 min treament; 50% duty cycle Cathode at [employer], anode at proximal rec fem  NMES unit discussed for home and usage of [place] setting as well as muscle pump setting for edema management  Exercises - Seated Ankle Pumps on Table  - 10 x daily - 7 x weekly - 1 sets - 10 reps - Supine Heel Slide  - 5 x daily - 7 x weekly - 1 sets - 5 reps - Supine Active Straight Leg Raise  - 3 x daily - 7 x weekly - 5 sets - 5 reps - Sidelying Hip Abduction  - 3 x daily - 7 x weekly - 3 sets - 10 reps - Sidelying Hip Circles  - 3 x daily - 7 x weekly - 4 sets -  10 reps - Prone Hip Extension with Plantarflexion  - 3 x daily - 7 x weekly - 2 sets - 10 reps    PATIENT EDUCATION:  Education details: surgical precautions, anatomy, DOMS expectations, muscle firing, HEP, POC  Person educated: Patient and Spouse Education method: Explanation, Demonstration, Tactile cues, Verbal cues, and Handouts Education comprehension: verbalized understanding, returned demonstration, verbal cues required, tactile cues required, and needs further education  HOME EXERCISE PROGRAM: [phone]    ASSESSMENT:  CLINICAL IMPRESSION: The patient continues to make great progress. Total arc was measured at 3-113 with out pain. We reviewed gait training with 1 crutch. She was advised to wean slowly off the other crutch but to do as tolerated. We were able to advance her weight while keeping her RPE around 5-6. We increased her reps. We also added TKEs/.  REHAB POTENTIAL: Good  CLINICAL DECISION MAKING: Stable/uncomplicated  EVALUATION COMPLEXITY: Low   GOALS:  SHORT TERM GOALS: Target date: 5/31  Gait pattern WFL without AD, pain <2/10 Baseline: WBAT with bil axillary crutches at eval Goal status: INITIAL  2.  Knee ROM 0-120 Baseline: limit to 90 in first 2 weeks Goal status: INITIAL  3.  SLS with proximal control for level pelvis Baseline: not appropriate to test at eval Goal status: INITIAL   LONG TERM GOALS: Target date: POC date  Meet FOTO goal Baseline:  Goal status: INITIAL  2.  Lunges and stairs with level pelvis and LE control without pain Baseline:  Goal status: INITIAL  3.  Good tolerance to repetitive motion on bike for at least 10 min without pain Baseline:  Goal status: INITIAL  4.  Extend POC Baseline: current POC to 8 weeks- pt wants to return to a very high level of function, futher goals to be set at this POC date  Goal status: INITIAL    PLAN:  PT FREQUENCY: 1-2x/week  PT DURATION: 8 weeks  PLANNED INTERVENTIONS:  Therapeutic exercises, Therapeutic activity, Neuromuscular re-education, Balance training, Gait training, Patient/Family education, Self Care, Joint mobilization, Stair training, Aquatic Therapy, Dry Needling, Electrical stimulation, Spinal mobilization, Cryotherapy, Moist heat, scar mobilization, Taping, Ultrasound, Ionotophoresis 4mg /ml Dexamethasone, Manual therapy, and Re-evaluation.  PLAN FOR NEXT SESSION: per protocol   Lorayne Bender PT DPT  06/19/22 9:52 AM

## 2022-07-02 ENCOUNTER — Ambulatory Visit (HOSPITAL_BASED_OUTPATIENT_CLINIC_OR_DEPARTMENT_OTHER): Payer: 59 | Admitting: Physical Therapy

## 2022-07-02 ENCOUNTER — Encounter (HOSPITAL_BASED_OUTPATIENT_CLINIC_OR_DEPARTMENT_OTHER): Payer: Self-pay

## 2022-07-04 ENCOUNTER — Encounter (HOSPITAL_BASED_OUTPATIENT_CLINIC_OR_DEPARTMENT_OTHER): Payer: Self-pay | Admitting: Physical Therapy

## 2022-07-04 ENCOUNTER — Ambulatory Visit (HOSPITAL_BASED_OUTPATIENT_CLINIC_OR_DEPARTMENT_OTHER): Payer: 59 | Attending: Orthopaedic Surgery | Admitting: Physical Therapy

## 2022-07-04 DIAGNOSIS — M25562 Pain in left knee: Secondary | ICD-10-CM | POA: Diagnosis present

## 2022-07-04 DIAGNOSIS — R262 Difficulty in walking, not elsewhere classified: Secondary | ICD-10-CM | POA: Diagnosis present

## 2022-07-04 NOTE — Therapy (Unsigned)
OUTPATIENT PHYSICAL THERAPY TREATMENT   Patient Name: Crystal Parks MRN: [id number] DOB:04-20-1975, 47 y.o., female Today's Date: 06/19/2022  END OF SESSION:  PT End of Session - 06/19/22 0949     Visit Number 3    Number of Visits 17    Date for PT Re-Evaluation 08/03/22    Authorization Type UHC    PT Start Time 1235    PT Stop Time 1315    PT Time Calculation (min) 40 min    Activity Tolerance Patient tolerated treatment well    Behavior During Therapy WFL for tasks assessed/performed               Past Medical History:  Diagnosis Date   ADHD (attention deficit hyperactivity disorder)    Arthritis    bilateral hands/fingers   Complication of anesthesia    Woke up extremely cold after anesthesia. Required warm blankets.   Headache    Migraine   Hypothyroidism    Thyroid disease    Past Surgical History:  Procedure Laterality Date   ceas     c-section   KNEE ARTHROSCOPY WITH MENISCAL REPAIR Left 06/04/2022   Procedure: LEFT KNEE ARTHROSCOPY WITH MEDIAL MENISCAL REPAIR;  Surgeon: Huel Cote, MD;  Location: [facility] OR;  Service: Orthopedics;  Laterality: Left;   REDUCTION MAMMAPLASTY Bilateral    age 68   WISDOM TOOTH EXTRACTION     Patient Active Problem List   Diagnosis Date Noted   Acute medial meniscus tear of left knee 06/04/2022   Inappropriate sinus tachycardia 06/30/2018   Palpitations 06/30/2018   Acute cystitis with hematuria 11/21/2016   Hypothyroidism 11/29/2015   Impaired fasting glucose 11/29/2015   Myalgia 11/29/2015   Female pattern alopecia 11/15/2011   Telogen effluvium 11/15/2011   ALLERGIC RHINITIS 10/18/2009   ASTHMA 10/18/2009   Asthma 10/18/2009   REFERRING PROVIDER: Steward Drone, MD  REFERRING DIAG:  S83.242A (ICD-10-CM) - Acute medial meniscus tear of left knee, initial encounter    Post op Lt medial meniscus repair  Rationale for Evaluation and Treatment: Rehabilitation  THERAPY DIAG:  Acute pain of left knee  Difficulty  in walking, not elsewhere classified  ONSET DATE: DOS 06/04/22  Days since surgery: 30    SUBJECTIVE:                                                                                                                                                                                           SUBJECTIVE STATEMENT: The patient continues to do well. She hasn't had much pain. She is using a cane to walk.   PERTINENT HISTORY:  hypothyroidism  PAIN:  Are  you having pain? Yes: NPRS scale: 2/10 Pain location: Lt knee, mostly post Pain description: stiff, sore Aggravating factors: moving from straight to angled Relieving factors: ice, walking improved quick pain after moving  PRECAUTIONS: None  WEIGHT BEARING RESTRICTIONS: Yes POSTOPERATIVE PLAN: She will be weightbearing as tolerated.  I would like her to avoid no flexion beyond 90 degrees for the first 2 weeks.  She will be given crutches for assistance with weightbearing.  I will see her back in 2 weeks for suture removal  FALLS:  Has patient fallen in last 6 months? Yes. Number of falls 1  LIVING ENVIRONMENT: There is a finished basement but only go down there if doing laundry  OCCUPATION: mental health therapist  PLOF: Independent  PATIENT GOALS: grappling   OBJECTIVE:   PATIENT SURVEYS:  FOTO EVAL: 29  Active   Left 5/15 Left  5/29  Hip flexion     Hip extension     Hip abduction     Hip adduction     Hip internal rotation     Hip external rotation     Knee flexion  90 113  Knee extension  0 -3  Ankle dorsiflexion     Ankle plantarflexion     Ankle inversion     Ankle eversion      (Blank rows = not tested)    TREATMENT:                                                                                                                              6/5 Manual: PROM into flexion and extension; Trigger point release to IT band and posterior Knee   Nu-step 6 min L2 for ROM   SLR x20 2lbs lbs RPE of 6   SL SLR  2x20  2lb   SAQ 2x15   Gatroc stretch 2x20sec hold       5/29  Manual: PROM into flexion and extension; Trigger point release to IT band and posterior Knee   Nu-step 6 min L2 for ROM   SLR x20 1.5 lbs RPE of 6   SL SLR  2x10 1.5   Supine extension 2x10    Heel raise 2x20   TKE 3x10 red       PATIENT EDUCATION:  Education details: surgical precautions, anatomy, DOMS expectations, muscle firing, HEP, POC  Person educated: Patient and Spouse Education method: Explanation, Demonstration, Tactile cues, Verbal cues, and Handouts Education comprehension: verbalized understanding, returned demonstration, verbal cues required, tactile cues required, and needs further education  HOME EXERCISE PROGRAM: [phone]    ASSESSMENT:  CLINICAL IMPRESSION: The patient feels like her extension is the most limited thing right now. She was at -3 coming in. We performed IASTYM to her hamstring and gastroc. Her gastroc seem the most limited. She was shown a stretch to do for home. We continue to progressively load her exercises. We will likely add TKE and step up next visit. The patient  is making excellent progress.   REHAB POTENTIAL: Good  CLINICAL DECISION MAKING: Stable/uncomplicated  EVALUATION COMPLEXITY: Low   GOALS:  SHORT TERM GOALS: Target date: 5/31  Gait pattern WFL without AD, pain <2/10 Baseline: WBAT with bil axillary crutches at eval Goal status: INITIAL  2.  Knee ROM 0-120 Baseline: limit to 90 in first 2 weeks Goal status: INITIAL  3.  SLS with proximal control for level pelvis Baseline: not appropriate to test at eval Goal status: INITIAL   LONG TERM GOALS: Target date: POC date  Meet FOTO goal Baseline:  Goal status: INITIAL  2.  Lunges and stairs with level pelvis and LE control without pain Baseline:  Goal status: INITIAL  3.  Good tolerance to repetitive motion on bike for at least 10 min without pain Baseline:  Goal status: INITIAL  4.   Extend POC Baseline: current POC to 8 weeks- pt wants to return to a very high level of function, futher goals to be set at this POC date  Goal status: INITIAL    PLAN:  PT FREQUENCY: 1-2x/week  PT DURATION: 8 weeks  PLANNED INTERVENTIONS: Therapeutic exercises, Therapeutic activity, Neuromuscular re-education, Balance training, Gait training, Patient/Family education, Self Care, Joint mobilization, Stair training, Aquatic Therapy, Dry Needling, Electrical stimulation, Spinal mobilization, Cryotherapy, Moist heat, scar mobilization, Taping, Ultrasound, Ionotophoresis 4mg /ml Dexamethasone, Manual therapy, and Re-evaluation.  PLAN FOR NEXT SESSION: per protocol   Lorayne Bender PT DPT  06/19/22 9:52 AM

## 2022-07-05 ENCOUNTER — Encounter (HOSPITAL_BASED_OUTPATIENT_CLINIC_OR_DEPARTMENT_OTHER): Payer: Self-pay | Admitting: Physical Therapy

## 2022-07-07 ENCOUNTER — Encounter (HOSPITAL_BASED_OUTPATIENT_CLINIC_OR_DEPARTMENT_OTHER): Payer: Self-pay

## 2022-07-09 ENCOUNTER — Ambulatory Visit (HOSPITAL_BASED_OUTPATIENT_CLINIC_OR_DEPARTMENT_OTHER): Payer: 59 | Admitting: Physical Therapy

## 2022-07-09 DIAGNOSIS — M25562 Pain in left knee: Secondary | ICD-10-CM | POA: Diagnosis not present

## 2022-07-09 DIAGNOSIS — R262 Difficulty in walking, not elsewhere classified: Secondary | ICD-10-CM

## 2022-07-09 NOTE — Therapy (Signed)
OUTPATIENT PHYSICAL THERAPY TREATMENT   Patient Name: Crystal Parks MRN: [id number] DOB:Mar 20, 1975, 47 y.o., female Today's Date: 06/19/2022  END OF SESSION:  PT End of Session - 06/19/22 0949     Visit Number 3    Number of Visits 17    Date for PT Re-Evaluation 08/03/22    Authorization Type UHC    PT Start Time 1235    PT Stop Time 1315    PT Time Calculation (min) 40 min    Activity Tolerance Patient tolerated treatment well    Behavior During Therapy WFL for tasks assessed/performed               Past Medical History:  Diagnosis Date   ADHD (attention deficit hyperactivity disorder)    Arthritis    bilateral hands/fingers   Complication of anesthesia    Woke up extremely cold after anesthesia. Required warm blankets.   Headache    Migraine   Hypothyroidism    Thyroid disease    Past Surgical History:  Procedure Laterality Date   ceas     c-section   KNEE ARTHROSCOPY WITH MENISCAL REPAIR Left 06/04/2022   Procedure: LEFT KNEE ARTHROSCOPY WITH MEDIAL MENISCAL REPAIR;  Surgeon: Huel Cote, MD;  Location: [facility] OR;  Service: Orthopedics;  Laterality: Left;   REDUCTION MAMMAPLASTY Bilateral    age 40   WISDOM TOOTH EXTRACTION     Patient Active Problem List   Diagnosis Date Noted   Acute medial meniscus tear of left knee 06/04/2022   Inappropriate sinus tachycardia 06/30/2018   Palpitations 06/30/2018   Acute cystitis with hematuria 11/21/2016   Hypothyroidism 11/29/2015   Impaired fasting glucose 11/29/2015   Myalgia 11/29/2015   Female pattern alopecia 11/15/2011   Telogen effluvium 11/15/2011   ALLERGIC RHINITIS 10/18/2009   ASTHMA 10/18/2009   Asthma 10/18/2009   REFERRING PROVIDER: Steward Drone, MD  REFERRING DIAG:  S83.242A (ICD-10-CM) - Acute medial meniscus tear of left knee, initial encounter    Post op Lt medial meniscus repair  Rationale for Evaluation and Treatment: Rehabilitation  THERAPY DIAG:  Acute pain of left knee  Difficulty  in walking, not elsewhere classified  ONSET DATE: DOS 06/04/22  Days since surgery: 30    SUBJECTIVE:                                                                                                                                                                                           SUBJECTIVE STATEMENT: The patient continues to do well. She hasn't had much pain. She is using a cane to walk.   PERTINENT HISTORY:  hypothyroidism  PAIN:  Are  you having pain? Yes: NPRS scale: 2/10 Pain location: Lt knee, mostly post Pain description: stiff, sore Aggravating factors: moving from straight to angled Relieving factors: ice, walking improved quick pain after moving  PRECAUTIONS: None  WEIGHT BEARING RESTRICTIONS: Yes POSTOPERATIVE PLAN: She will be weightbearing as tolerated.  I would like her to avoid no flexion beyond 90 degrees for the first 2 weeks.  She will be given crutches for assistance with weightbearing.  I will see her back in 2 weeks for suture removal  FALLS:  Has patient fallen in last 6 months? Yes. Number of falls 1  LIVING ENVIRONMENT: There is a finished basement but only go down there if doing laundry  OCCUPATION: mental health therapist  PLOF: Independent  PATIENT GOALS: grappling   OBJECTIVE:   PATIENT SURVEYS:  FOTO EVAL: 29  Active   Left 5/15 Left  5/29  Hip flexion     Hip extension     Hip abduction     Hip adduction     Hip internal rotation     Hip external rotation     Knee flexion  90 113  Knee extension  0 -3  Ankle dorsiflexion     Ankle plantarflexion     Ankle inversion     Ankle eversion      (Blank rows = not tested)    TREATMENT:                                                                                                                              6/10 Manual: PROM into flexion and extension; Trigger point release to IT band and posterior Knee   Nu-step 6 min L2 for ROM   SLR x20 2lbs lbs RPE of 6   SL SLR   2x20 2lb   SAQ 2x15   Step up 2x10 2-4 inches   Bridge x20       6/5 Manual: PROM into flexion and extension; Trigger point release to IT band and posterior Knee   Nu-step 6 min L2 for ROM   SLR x20 2lbs lbs RPE of 6   SL SLR  2x20 2lb   SAQ 2x15   Gatroc stretch 2x20sec hold       5/29  Manual: PROM into flexion and extension; Trigger point release to IT band and posterior Knee   Nu-step 6 min L2 for ROM   SLR x20 1.5 lbs RPE of 6   SL SLR  2x10 1.5   Supine extension 2x10    Heel raise 2x20   TKE 3x10 red       PATIENT EDUCATION:  Education details: surgical precautions, anatomy, DOMS expectations, muscle firing, HEP, POC  Person educated: Patient and Spouse Education method: Explanation, Demonstration, Tactile cues, Verbal cues, and Handouts Education comprehension: verbalized understanding, returned demonstration, verbal cues required, tactile cues required, and needs further education  HOME EXERCISE PROGRAM: [id number]    ASSESSMENT:  CLINICAL IMPRESSION:  Therapy progressed to stair training today. She tolerated well. She had no increase in pain. Per visual inspection her extension has improved as well. Her flexion is near end range. We will continue to progress strengthening exercises as tolerated. We will likely have to increase her load with base exercises next visit.   REHAB POTENTIAL: Good  CLINICAL DECISION MAKING: Stable/uncomplicated  EVALUATION COMPLEXITY: Low   GOALS:  SHORT TERM GOALS: Target date: 5/31  Gait pattern WFL without AD, pain <2/10 Baseline: WBAT with bil axillary crutches at eval Goal status: INITIAL  2.  Knee ROM 0-120 Baseline: limit to 90 in first 2 weeks Goal status: INITIAL  3.  SLS with proximal control for level pelvis Baseline: not appropriate to test at eval Goal status: INITIAL   LONG TERM GOALS: Target date: POC date  Meet FOTO goal Baseline:  Goal status: INITIAL  2.  Lunges and  stairs with level pelvis and LE control without pain Baseline:  Goal status: INITIAL  3.  Good tolerance to repetitive motion on bike for at least 10 min without pain Baseline:  Goal status: INITIAL  4.  Extend POC Baseline: current POC to 8 weeks- pt wants to return to a very high level of function, futher goals to be set at this POC date  Goal status: INITIAL    PLAN:  PT FREQUENCY: 1-2x/week  PT DURATION: 8 weeks  PLANNED INTERVENTIONS: Therapeutic exercises, Therapeutic activity, Neuromuscular re-education, Balance training, Gait training, Patient/Family education, Self Care, Joint mobilization, Stair training, Aquatic Therapy, Dry Needling, Electrical stimulation, Spinal mobilization, Cryotherapy, Moist heat, scar mobilization, Taping, Ultrasound, Ionotophoresis 4mg /ml Dexamethasone, Manual therapy, and Re-evaluation.  PLAN FOR NEXT SESSION: per protocol   Lorayne Bender PT DPT  06/19/22 9:52 AM

## 2022-07-10 ENCOUNTER — Encounter (HOSPITAL_BASED_OUTPATIENT_CLINIC_OR_DEPARTMENT_OTHER): Payer: Self-pay | Admitting: Physical Therapy

## 2022-07-11 ENCOUNTER — Ambulatory Visit (HOSPITAL_BASED_OUTPATIENT_CLINIC_OR_DEPARTMENT_OTHER): Payer: 59 | Admitting: Physical Therapy

## 2022-07-11 ENCOUNTER — Encounter (HOSPITAL_BASED_OUTPATIENT_CLINIC_OR_DEPARTMENT_OTHER): Payer: Self-pay

## 2022-07-14 ENCOUNTER — Encounter (HOSPITAL_BASED_OUTPATIENT_CLINIC_OR_DEPARTMENT_OTHER): Payer: Self-pay

## 2022-07-18 ENCOUNTER — Ambulatory Visit (HOSPITAL_BASED_OUTPATIENT_CLINIC_OR_DEPARTMENT_OTHER): Payer: 59 | Admitting: Orthopaedic Surgery

## 2022-07-18 DIAGNOSIS — S83242A Other tear of medial meniscus, current injury, left knee, initial encounter: Secondary | ICD-10-CM

## 2022-07-18 NOTE — Progress Notes (Signed)
Post Operative Evaluation    Procedure/Date of Surgery: Left knee medial meniscal repair   Interval History:   Presents today 6 weeks status post above procedure.  She is continuing to improve although she does have some quadricep weakness at today's visit.  She is continuing to work in physical therapy.  She is now weightbearing as tolerated.  She has hyperextension with at least 120 degrees of flexion.   PMH/PSH/Family History/Social History/Meds/Allergies:    Past Medical History:  Diagnosis Date   ADHD (attention deficit hyperactivity disorder)    Arthritis    bilateral hands/fingers   Complication of anesthesia    Woke up extremely cold after anesthesia. Required warm blankets.   Headache    Migraine   Hypothyroidism    Thyroid disease    Past Surgical History:  Procedure Laterality Date   ceas     c-section   KNEE ARTHROSCOPY WITH MENISCAL REPAIR Left 06/04/2022   Procedure: LEFT KNEE ARTHROSCOPY WITH MEDIAL MENISCAL REPAIR;  Surgeon: Huel Cote, MD;  Location: [facility] OR;  Service: Orthopedics;  Laterality: Left;   REDUCTION MAMMAPLASTY Bilateral    age 47   WISDOM TOOTH EXTRACTION     Social History   Socioeconomic History   Marital status: Married    Spouse name: Not on file   Number of children: Not on file   Years of education: Not on file   Highest education level: Not on file  Occupational History   Not on file  Tobacco Use   Smoking status: Never   Smokeless tobacco: Never  Substance and Sexual Activity   Alcohol use: Yes    Comment: occasionally   Drug use: No   Sexual activity: Yes  Other Topics Concern   Not on file  Social History Narrative   Not on file   Social Determinants of Health   Financial Resource Strain: Not on file  Food Insecurity: Not on file  Transportation Needs: Not on file  Physical Activity: Not on file  Stress: Not on file  Social Connections: Not on file   No family history on  file. Allergies  Allergen Reactions   Food Other (See Comments)    Eggplant-hives/itching/oral swelling   Ciprofloxacin Itching, Nausea And Vomiting and Rash    Hallucinations/fever   Current Outpatient Medications  Medication Sig Dispense Refill   ALPRAZolam (XANAX) 0.5 MG tablet Take 0.5 mg by mouth daily as needed for anxiety.     aspirin EC 325 MG tablet Take 1 tablet (325 mg total) by mouth daily. 14 tablet 0   COLLAGEN PO Take 1 tablet by mouth daily in the afternoon. (Gummy)     levocetirizine (XYZAL) 5 MG tablet Take 5 mg by mouth daily in the afternoon.     MAGNESIUM PO Take 1 tablet by mouth in the morning and at bedtime. (Gummy)     MOUNJARO 2.5 MG/0.5ML Pen Inject 2.5 mg into the skin every Monday.     NP THYROID 15 MG tablet Take 15 mg by mouth See admin instructions. 15 mg + 60 mg=75 mg (take 75 mg dose by mouth every other day--alternating with 90 mg dosage)     NP THYROID 90 MG tablet Take 90 mg by mouth every other day. (Take 90 mg dose by mouth every other day--alternating with 75 mg  dosage)     oxyCODONE (ROXICODONE) 5 MG immediate release tablet Take 1 tablet (5 mg total) by mouth every 4 (four) hours as needed for severe pain or breakthrough pain. 5 tablet 0   thyroid (ARMOUR) 60 MG tablet Take 60 mg by mouth See admin instructions. 60 mg + 15 mg=75 mg (take 75 mg dose by mouth every other day--alternating with 90 mg dosage)     Triamcinolone Acetonide (NASACORT AQ NA) Place 1 spray into the nose every evening.     No current facility-administered medications for this visit.   No results found.  Review of Systems:   A ROS was performed including pertinent positives and negatives as documented in the HPI.   Musculoskeletal Exam:      Left knee incisions are well-appearing without erythema or drainage.  There is mild quad atrophy.  There is some difficulty with activation of the quad. -2 - 125.  Swelling is mild.  Imaging:      I personally reviewed and  interpreted the radiographs.   Assessment:   6 weeks status post left knee medial meniscal repair overall doing well.  Continues to improve.  Range of motion is now improved with hyperextension.  At this time she will continue to work on strengthening of the quad.  I do believe specifically that she would benefit from blood flow restriction of the quad.  Appendix here back in 6 weeks for reassessment  Plan :    -Return to clinic 6 weeks for reassessment      I personally saw and evaluated the patient, and participated in the management and treatment plan.  Huel Cote, MD Attending Physician, Orthopedic Surgery  This document was dictated using Dragon voice recognition software. A reasonable attempt at proof reading has been made to minimize errors.

## 2022-07-23 ENCOUNTER — Encounter (HOSPITAL_BASED_OUTPATIENT_CLINIC_OR_DEPARTMENT_OTHER): Payer: Self-pay | Admitting: Physical Therapy

## 2022-07-23 ENCOUNTER — Ambulatory Visit (HOSPITAL_BASED_OUTPATIENT_CLINIC_OR_DEPARTMENT_OTHER): Payer: 59 | Admitting: Physical Therapy

## 2022-07-23 DIAGNOSIS — M25562 Pain in left knee: Secondary | ICD-10-CM

## 2022-07-23 DIAGNOSIS — R262 Difficulty in walking, not elsewhere classified: Secondary | ICD-10-CM

## 2022-07-23 NOTE — Therapy (Signed)
OUTPATIENT PHYSICAL THERAPY TREATMENT   Patient Name: Crystal Parks MRN: [id number] DOB:1975/03/08, 47 y.o., female Today's Date: 07/23/2022  END OF SESSION:  PT End of Session - 07/23/22 1620     Visit Number 6    Number of Visits 17    Date for PT Re-Evaluation 08/03/22    Authorization Type [employer]    PT Start Time 1445    PT Stop Time 1528    PT Time Calculation (min) 43 min    Activity Tolerance Patient tolerated treatment well    Behavior During Therapy WFL for tasks assessed/performed                Past Medical History:  Diagnosis Date   ADHD (attention deficit hyperactivity disorder)    Arthritis    bilateral hands/fingers   Complication of anesthesia    Woke up extremely cold after anesthesia. Required warm blankets.   Headache    Migraine   Hypothyroidism    Thyroid disease    Past Surgical History:  Procedure Laterality Date   ceas     c-section   KNEE ARTHROSCOPY WITH MENISCAL REPAIR Left 06/04/2022   Procedure: LEFT KNEE ARTHROSCOPY WITH MEDIAL MENISCAL REPAIR;  Surgeon: Huel Cote, MD;  Location: [facility] OR;  Service: Orthopedics;  Laterality: Left;   REDUCTION MAMMAPLASTY Bilateral    age 5   WISDOM TOOTH EXTRACTION     Patient Active Problem List   Diagnosis Date Noted   Acute medial meniscus tear of left knee 06/04/2022   Inappropriate sinus tachycardia 06/30/2018   Palpitations 06/30/2018   Acute cystitis with hematuria 11/21/2016   Hypothyroidism 11/29/2015   Impaired fasting glucose 11/29/2015   Myalgia 11/29/2015   Female pattern alopecia 11/15/2011   Telogen effluvium 11/15/2011   ALLERGIC RHINITIS 10/18/2009   ASTHMA 10/18/2009   Asthma 10/18/2009   REFERRING PROVIDER: Steward Drone, MD  REFERRING DIAG:  S83.242A (ICD-10-CM) - Acute medial meniscus tear of left knee, initial encounter    Post op Lt medial meniscus repair  Rationale for Evaluation and Treatment: Rehabilitation  THERAPY DIAG:  Acute pain of left  knee  Difficulty in walking, not elsewhere classified  ONSET DATE: DOS 06/04/22  Days since surgery: 49    SUBJECTIVE:                                                                                                                                                                                           SUBJECTIVE STATEMENT:  Pt is able to go reciprocally with steps. She has moments of pain in the R knee with pushing off. Pt is able to get into  a wall squat now.  PERTINENT HISTORY:  hypothyroidism  PAIN:  Are you having pain? Yes: NPRS scale: 2/10 Pain location: Lt knee, mostly post Pain description: stiff, sore Aggravating factors: moving from straight to angled Relieving factors: ice, walking improved quick pain after moving  PRECAUTIONS: None  WEIGHT BEARING RESTRICTIONS: Yes POSTOPERATIVE PLAN: She will be weightbearing as tolerated.  I would like her to avoid no flexion beyond 90 degrees for the first 2 weeks.  She will be given crutches for assistance with weightbearing.  I will see her back in 2 weeks for suture removal  FALLS:  Has patient fallen in last 6 months? Yes. Number of falls 1  LIVING ENVIRONMENT: There is a finished basement but only go down there if doing laundry  OCCUPATION: mental health therapist  PLOF: Independent  PATIENT GOALS: grappling   OBJECTIVE:   PATIENT SURVEYS:  FOTO EVAL: 29  Active   Left 5/15 Left  5/29 L 6/24  Hip flexion      Hip extension      Hip abduction      Hip adduction      Hip internal rotation      Hip external rotation      Knee flexion  90 113 115  Knee extension  0 -3 1  Ankle dorsiflexion      Ankle plantarflexion      Ankle inversion      Ankle eversion       (Blank rows = not tested)    TREATMENT:      6/24  L knee patellar mobs in all 4 directions grade III  STM L vastus lateralis, L biceps fem  SL bridge 3x8 Sidestepping GTB at ankles 17ft x3 laps  Quad burner 15s 4x  5 lb LAQ  2x10  BFR mini squat 80% occlusion 205 mmHg smart pressure (30, 15, 15, 15) 30s rest                                                                                                                             6/10 Manual: PROM into flexion and extension; Trigger point release to IT band and posterior Knee   Nu-step 6 min L2 for ROM   SLR x20 2lbs lbs RPE of 6   SL SLR  2x20 2lb   SAQ 2x15   Step up 2x10 2-4 inches   Bridge x20       6/5 Manual: PROM into flexion and extension; Trigger point release to IT band and posterior Knee   Nu-step 6 min L2 for ROM   SLR x20 2lbs lbs RPE of 6   SL SLR  2x20 2lb   SAQ 2x15   Gatroc stretch 2x20sec hold       5/29  Manual: PROM into flexion and extension; Trigger point release to IT band and posterior Knee   Nu-step 6 min L2 for ROM   SLR x20 1.5 lbs RPE of  6   SL SLR  2x10 1.5   Supine extension 2x10    Heel raise 2x20   TKE 3x10 red       PATIENT EDUCATION:  Education details: surgical precautions, anatomy, DOMS expectations, muscle firing, HEP, POC  Person educated: Patient and Spouse Education method: Explanation, Demonstration, Tactile cues, Verbal cues, and Handouts Education comprehension: verbalized understanding, returned demonstration, verbal cues required, tactile cues required, and needs further education  HOME EXERCISE PROGRAM: [phone]    ASSESSMENT:  CLINICAL IMPRESSION: Pt with very good tolerance to progression of exercise today with focus on quad activation, L proximal hip strengthening, and intro to small range CKC movements.  Patient had no pain noted during session.  Patient able to reach knee hyperextension and 115 degrees of knee flexion.  Patient with expected fatigue during BFR and advised potential signs of DVT.  Patient gave verbal understanding.  Home exercise updated today to progress quad strength and hip strength.  Plan to continue per protocol with no loaded knee flexion  greater than 90 degrees.  Patient will benefit from continued skilled therapy in order to address functional deficits and improve left knee strength and range of motion for return to prior level of function.  REHAB POTENTIAL: Good  CLINICAL DECISION MAKING: Stable/uncomplicated  EVALUATION COMPLEXITY: Low   GOALS:  SHORT TERM GOALS: Target date: 5/31  Gait pattern WFL without AD, pain <2/10 Baseline: WBAT with bil axillary crutches at eval Goal status: INITIAL  2.  Knee ROM 0-120 Baseline: limit to 90 in first 2 weeks Goal status: INITIAL  3.  SLS with proximal control for level pelvis Baseline: not appropriate to test at eval Goal status: INITIAL   LONG TERM GOALS: Target date: POC date  Meet FOTO goal Baseline:  Goal status: INITIAL  2.  Lunges and stairs with level pelvis and LE control without pain Baseline:  Goal status: INITIAL  3.  Good tolerance to repetitive motion on bike for at least 10 min without pain Baseline:  Goal status: INITIAL  4.  Extend POC Baseline: current POC to 8 weeks- pt wants to return to a very high level of function, futher goals to be set at this POC date  Goal status: INITIAL    PLAN:  PT FREQUENCY: 1-2x/week  PT DURATION: 8 weeks  PLANNED INTERVENTIONS: Therapeutic exercises, Therapeutic activity, Neuromuscular re-education, Balance training, Gait training, Patient/Family education, Self Care, Joint mobilization, Stair training, Aquatic Therapy, Dry Needling, Electrical stimulation, Spinal mobilization, Cryotherapy, Moist heat, scar mobilization, Taping, Ultrasound, Ionotophoresis 4mg /ml Dexamethasone, Manual therapy, and Re-evaluation.  PLAN FOR NEXT SESSION: per protocol   Zebedee Iba PT, DPT 07/23/22 4:20 PM

## 2022-07-30 ENCOUNTER — Ambulatory Visit (HOSPITAL_BASED_OUTPATIENT_CLINIC_OR_DEPARTMENT_OTHER): Payer: 59 | Attending: Orthopaedic Surgery | Admitting: Physical Therapy

## 2022-07-30 DIAGNOSIS — R262 Difficulty in walking, not elsewhere classified: Secondary | ICD-10-CM | POA: Insufficient documentation

## 2022-07-30 DIAGNOSIS — M25562 Pain in left knee: Secondary | ICD-10-CM | POA: Insufficient documentation

## 2022-08-01 ENCOUNTER — Ambulatory Visit (INDEPENDENT_AMBULATORY_CARE_PROVIDER_SITE_OTHER): Payer: 59 | Admitting: Orthopaedic Surgery

## 2022-08-01 DIAGNOSIS — S83242A Other tear of medial meniscus, current injury, left knee, initial encounter: Secondary | ICD-10-CM

## 2022-08-01 NOTE — Progress Notes (Signed)
Appointment was rescheduled to the typical 4 weeks postop

## 2022-08-06 ENCOUNTER — Encounter (HOSPITAL_BASED_OUTPATIENT_CLINIC_OR_DEPARTMENT_OTHER): Payer: Self-pay | Admitting: Physical Therapy

## 2022-08-06 ENCOUNTER — Ambulatory Visit (HOSPITAL_BASED_OUTPATIENT_CLINIC_OR_DEPARTMENT_OTHER): Payer: 59 | Admitting: Physical Therapy

## 2022-08-06 DIAGNOSIS — R262 Difficulty in walking, not elsewhere classified: Secondary | ICD-10-CM

## 2022-08-06 DIAGNOSIS — M25562 Pain in left knee: Secondary | ICD-10-CM

## 2022-08-06 NOTE — Therapy (Signed)
OUTPATIENT PHYSICAL THERAPY TREATMENT   Patient Name: Crystal Parks MRN: [id number] DOB:1975/09/22, 47 y.o., female Today's Date: 08/06/2022  END OF SESSION:  PT End of Session - 08/06/22 1445     Visit Number 7    Number of Visits 17    Date for PT Re-Evaluation 08/03/22    Authorization Type [employer]    PT Start Time 1445    PT Stop Time 1526    PT Time Calculation (min) 41 min    Activity Tolerance Patient tolerated treatment well    Behavior During Therapy WFL for tasks assessed/performed                 Past Medical History:  Diagnosis Date   ADHD (attention deficit hyperactivity disorder)    Arthritis    bilateral hands/fingers   Complication of anesthesia    Woke up extremely cold after anesthesia. Required warm blankets.   Headache    Migraine   Hypothyroidism    Thyroid disease    Past Surgical History:  Procedure Laterality Date   ceas     c-section   KNEE ARTHROSCOPY WITH MENISCAL REPAIR Left 06/04/2022   Procedure: LEFT KNEE ARTHROSCOPY WITH MEDIAL MENISCAL REPAIR;  Surgeon: Huel Cote, MD;  Location: [facility] OR;  Service: Orthopedics;  Laterality: Left;   REDUCTION MAMMAPLASTY Bilateral    age 47   WISDOM TOOTH EXTRACTION     Patient Active Problem List   Diagnosis Date Noted   Acute medial meniscus tear of left knee 06/04/2022   Inappropriate sinus tachycardia 06/30/2018   Palpitations 06/30/2018   Acute cystitis with hematuria 11/21/2016   Hypothyroidism 11/29/2015   Impaired fasting glucose 11/29/2015   Myalgia 11/29/2015   Female pattern alopecia 11/15/2011   Telogen effluvium 11/15/2011   ALLERGIC RHINITIS 10/18/2009   ASTHMA 10/18/2009   Asthma 10/18/2009   REFERRING PROVIDER: Steward Drone, MD  REFERRING DIAG:  S83.242A (ICD-10-CM) - Acute medial meniscus tear of left knee, initial encounter    Post op Lt medial meniscus repair  Rationale for Evaluation and Treatment: Rehabilitation  THERAPY DIAG:  Acute pain of left  knee  Difficulty in walking, not elsewhere classified  ONSET DATE: DOS 06/04/22  Days since surgery: 63    SUBJECTIVE:                                                                                                                                                                                           SUBJECTIVE STATEMENT:  Pt able to go downstairs at this point. Pt returns today after being sick with tonsillitis. Knee feels stiff and is up to 75 minisquats  a day.   PERTINENT HISTORY:  hypothyroidism  PAIN:  Are you having pain? Yes: NPRS scale: 2/10 Pain location: Lt knee, mostly post Pain description: stiff, sore Aggravating factors: moving from straight to angled Relieving factors: ice, walking improved quick pain after moving  PRECAUTIONS: None  WEIGHT BEARING RESTRICTIONS: Yes POSTOPERATIVE PLAN: She will be weightbearing as tolerated.  I would like her to avoid no flexion beyond 90 degrees for the first 2 weeks.  She will be given crutches for assistance with weightbearing.  I will see her back in 2 weeks for suture removal  FALLS:  Has patient fallen in last 6 months? Yes. Number of falls 1  LIVING ENVIRONMENT: There is a finished basement but only go down there if doing laundry  OCCUPATION: mental health therapist  PLOF: Independent  PATIENT GOALS: grappling   OBJECTIVE:   PATIENT SURVEYS:  FOTO EVAL: 29  Active   Left 5/15 Left  5/29 L 6/24  Hip flexion      Hip extension      Hip abduction      Hip adduction      Hip internal rotation      Hip external rotation      Knee flexion  90 113 115  Knee extension  0 -3 1  Ankle dorsiflexion      Ankle plantarflexion      Ankle inversion      Ankle eversion       (Blank rows = not tested)    TREATMENT:     7/8 Recumbent bike L4.5 6 min   SL leg press 3x12 75lbs  Bosu squats 2x20 4" box lateral step down 3x5 Wall iso lunge hold 10s 4x Staggered stance box squat to chair height  3x8  6/24  L knee patellar mobs in all 4 directions grade III  STM L vastus lateralis, L biceps fem  SL bridge 3x8 Sidestepping GTB at ankles 4ft x3 laps  Quad burner 15s 4x  5 lb LAQ 2x10  BFR mini squat 80% occlusion 205 mmHg smart pressure (30, 15, 15, 15) 30s rest                                                                                                                             6/10 Manual: PROM into flexion and extension; Trigger point release to IT band and posterior Knee   Nu-step 6 min L2 for ROM   SLR x20 2lbs lbs RPE of 6   SL SLR  2x20 2lb   SAQ 2x15   Step up 2x10 2-4 inches   Bridge x20       6/5 Manual: PROM into flexion and extension; Trigger point release to IT band and posterior Knee   Nu-step 6 min L2 for ROM   SLR x20 2lbs lbs RPE of 6   SL SLR  2x20 2lb   SAQ 2x15   Gatroc stretch 2x20sec hold  5/29  Manual: PROM into flexion and extension; Trigger point release to IT band and posterior Knee   Nu-step 6 min L2 for ROM   SLR x20 1.5 lbs RPE of 6   SL SLR  2x10 1.5   Supine extension 2x10    Heel raise 2x20   TKE 3x10 red       PATIENT EDUCATION:  Education details: surgical precautions, anatomy, DOMS expectations, muscle firing, HEP, POC  Person educated: Patient and Spouse Education method: Explanation, Demonstration, Tactile cues, Verbal cues, and Handouts Education comprehension: verbalized understanding, returned demonstration, verbal cues required, tactile cues required, and needs further education  HOME EXERCISE PROGRAM: [id number]    ASSESSMENT:  CLINICAL IMPRESSION: Pt exercise progressed at today's session with focus on stability, proprioception, and CKC quad strength. Pt especially shaky and weak with SL CKC activity but with good tolerance once warmed up and with adequate rest breaks. Pt HEP updated for more L LE bias exercise. Pt advised to start cardio on bike and with swimming if she  choose. She may begin to shrimp and bear crawl for BJJ but advised on absolutely no contact and nothing at speed at this time. Plan to continue per protocol with progressing towards 90+ deg of flexion. Patient will benefit from continued skilled therapy in order to address functional deficits and improve left knee strength and range of motion for return to prior level of function.  REHAB POTENTIAL: Good  CLINICAL DECISION MAKING: Stable/uncomplicated  EVALUATION COMPLEXITY: Low   GOALS:  SHORT TERM GOALS: Target date: 5/31  Gait pattern WFL without AD, pain <2/10 Baseline: WBAT with bil axillary crutches at eval Goal status: INITIAL  2.  Knee ROM 0-120 Baseline: limit to 90 in first 2 weeks Goal status: INITIAL  3.  SLS with proximal control for level pelvis Baseline: not appropriate to test at eval Goal status: INITIAL   LONG TERM GOALS: Target date: POC date  Meet FOTO goal Baseline:  Goal status: INITIAL  2.  Lunges and stairs with level pelvis and LE control without pain Baseline:  Goal status: INITIAL  3.  Good tolerance to repetitive motion on bike for at least 10 min without pain Baseline:  Goal status: INITIAL  4.  Extend POC Baseline: current POC to 8 weeks- pt wants to return to a very high level of function, futher goals to be set at this POC date  Goal status: INITIAL    PLAN:  PT FREQUENCY: 1-2x/week  PT DURATION: 8 weeks  PLANNED INTERVENTIONS: Therapeutic exercises, Therapeutic activity, Neuromuscular re-education, Balance training, Gait training, Patient/Family education, Self Care, Joint mobilization, Stair training, Aquatic Therapy, Dry Needling, Electrical stimulation, Spinal mobilization, Cryotherapy, Moist heat, scar mobilization, Taping, Ultrasound, Ionotophoresis 4mg /ml Dexamethasone, Manual therapy, and Re-evaluation.  PLAN FOR NEXT SESSION: per protocol   Zebedee Iba PT, DPT 08/06/22 3:31 PM

## 2022-08-13 ENCOUNTER — Ambulatory Visit (HOSPITAL_BASED_OUTPATIENT_CLINIC_OR_DEPARTMENT_OTHER): Payer: 59 | Admitting: Physical Therapy

## 2022-08-13 ENCOUNTER — Encounter (HOSPITAL_BASED_OUTPATIENT_CLINIC_OR_DEPARTMENT_OTHER): Payer: Self-pay | Admitting: Physical Therapy

## 2022-08-13 DIAGNOSIS — R262 Difficulty in walking, not elsewhere classified: Secondary | ICD-10-CM

## 2022-08-13 DIAGNOSIS — M25562 Pain in left knee: Secondary | ICD-10-CM

## 2022-08-13 NOTE — Therapy (Signed)
OUTPATIENT PHYSICAL THERAPY TREATMENT   Patient Name: Crystal Parks MRN: [id number] DOB:09-25-1975, 47 y.o., female Today's Date: 08/13/2022  END OF SESSION:  PT End of Session - 08/13/22 1451     Visit Number 8    Number of Visits 17    Date for PT Re-Evaluation 08/03/22    Authorization Type [employer]    PT Start Time 1449    PT Stop Time 1528    PT Time Calculation (min) 39 min    Activity Tolerance Patient tolerated treatment well    Behavior During Therapy WFL for tasks assessed/performed                  Past Medical History:  Diagnosis Date   ADHD (attention deficit hyperactivity disorder)    Arthritis    bilateral hands/fingers   Complication of anesthesia    Woke up extremely cold after anesthesia. Required warm blankets.   Headache    Migraine   Hypothyroidism    Thyroid disease    Past Surgical History:  Procedure Laterality Date   ceas     c-section   KNEE ARTHROSCOPY WITH MENISCAL REPAIR Left 06/04/2022   Procedure: LEFT KNEE ARTHROSCOPY WITH MEDIAL MENISCAL REPAIR;  Surgeon: Huel Cote, MD;  Location: [facility] OR;  Service: Orthopedics;  Laterality: Left;   REDUCTION MAMMAPLASTY Bilateral    age 16   WISDOM TOOTH EXTRACTION     Patient Active Problem List   Diagnosis Date Noted   Acute medial meniscus tear of left knee 06/04/2022   Inappropriate sinus tachycardia 06/30/2018   Palpitations 06/30/2018   Acute cystitis with hematuria 11/21/2016   Hypothyroidism 11/29/2015   Impaired fasting glucose 11/29/2015   Myalgia 11/29/2015   Female pattern alopecia 11/15/2011   Telogen effluvium 11/15/2011   ALLERGIC RHINITIS 10/18/2009   ASTHMA 10/18/2009   Asthma 10/18/2009   REFERRING PROVIDER: Steward Drone, MD  REFERRING DIAG:  S83.242A (ICD-10-CM) - Acute medial meniscus tear of left knee, initial encounter    Post op Lt medial meniscus repair  Rationale for Evaluation and Treatment: Rehabilitation  THERAPY DIAG:  Acute pain of left  knee  Difficulty in walking, not elsewhere classified  ONSET DATE: DOS 06/04/22  Days since surgery: 70    SUBJECTIVE:                                                                                                                                                                                           SUBJECTIVE STATEMENT:  Pt states there is "something wrong" with the knee cap. Feels like gravel. Pt has the most pain with lateral step up. Pt has  also been recently increasing her walking more.   PERTINENT HISTORY:  hypothyroidism  PAIN:  Are you having pain? Yes: NPRS scale: 2/10 Pain location: Lt knee, mostly post Pain description: stiff, sore Aggravating factors: moving from straight to angled Relieving factors: ice, walking improved quick pain after moving  PRECAUTIONS: None  WEIGHT BEARING RESTRICTIONS: Yes POSTOPERATIVE PLAN: She will be weightbearing as tolerated.  I would like her to avoid no flexion beyond 90 degrees for the first 2 weeks.  She will be given crutches for assistance with weightbearing.  I will see her back in 2 weeks for suture removal  FALLS:  Has patient fallen in last 6 months? Yes. Number of falls 1  LIVING ENVIRONMENT: There is a finished basement but only go down there if doing laundry  OCCUPATION: mental health therapist  PLOF: Independent  PATIENT GOALS: grappling   OBJECTIVE:   PATIENT SURVEYS:  FOTO EVAL: 29  Active   Left 5/15 Left  5/29 L 6/24  Hip flexion      Hip extension      Hip abduction      Hip adduction      Hip internal rotation      Hip external rotation      Knee flexion  90 113 115  Knee extension  0 -3 1  Ankle dorsiflexion      Ankle plantarflexion      Ankle inversion      Ankle eversion       (Blank rows = not tested)    TREATMENT:      7/15  STM L quad and HS   Recumbent bike L4.5 6 min   Prone quad stretch 30s 3x Self foam roller to quad, active release 5 min   Reverse nordic 3s  2x10  Self pain management, massage therapy, recovery    7/8 Recumbent bike L4.5 6 min   SL leg press 3x12 75lbs  Bosu squats 2x20 4" box lateral step down 3x5 Wall iso lunge hold 10s 4x Staggered stance box squat to chair height 3x8  6/24  L knee patellar mobs in all 4 directions grade III  STM L vastus lateralis, L biceps fem  SL bridge 3x8 Sidestepping GTB at ankles 67ft x3 laps  Quad burner 15s 4x  5 lb LAQ 2x10  BFR mini squat 80% occlusion 205 mmHg smart pressure (30, 15, 15, 15) 30s rest                                                                                                                             6/10 Manual: PROM into flexion and extension; Trigger point release to IT band and posterior Knee   Nu-step 6 min L2 for ROM   SLR x20 2lbs lbs RPE of 6   SL SLR  2x20 2lb   SAQ 2x15   Step up 2x10 2-4 inches   Bridge x20       6/5  Manual: PROM into flexion and extension; Trigger point release to IT band and posterior Knee   Nu-step 6 min L2 for ROM   SLR x20 2lbs lbs RPE of 6   SL SLR  2x20 2lb   SAQ 2x15   Gatroc stretch 2x20sec hold       5/29  Manual: PROM into flexion and extension; Trigger point release to IT band and posterior Knee   Nu-step 6 min L2 for ROM   SLR x20 1.5 lbs RPE of 6   SL SLR  2x10 1.5   Supine extension 2x10    Heel raise 2x20   TKE 3x10 red       PATIENT EDUCATION:  Education details: surgical precautions, anatomy, DOMS expectations, muscle firing, HEP, POC  Person educated: Patient and Spouse Education method: Explanation, Demonstration, Tactile cues, Verbal cues, and Handouts Education comprehension: verbalized understanding, returned demonstration, verbal cues required, tactile cues required, and needs further education  HOME EXERCISE PROGRAM: [phone]    ASSESSMENT:  CLINICAL IMPRESSION: Pt presents today with anterior knee irritation, mild effusion, and soft tissue  restriction of the L knee cuasing pain. Pt session focused on active recovery and edu about pain management strategies and self STM for desensitization. Plan to keep same HEP for the next week and then progression strengthening as tolerated. Pt advised to be aware of volume of .oading on the knee to prevent a true tendinitis type issue. Pt with abolishment of pain following session. Continue with strength and balance as able at next. Consider Bosu and SL RDL movements. Patient will benefit from continued skilled therapy in order to address functional deficits and improve left knee strength and range of motion for return to prior level of function.  REHAB POTENTIAL: Good  CLINICAL DECISION MAKING: Stable/uncomplicated  EVALUATION COMPLEXITY: Low   GOALS:  SHORT TERM GOALS: Target date: 5/31  Gait pattern WFL without AD, pain <2/10 Baseline: WBAT with bil axillary crutches at eval Goal status: INITIAL  2.  Knee ROM 0-120 Baseline: limit to 90 in first 2 weeks Goal status: INITIAL  3.  SLS with proximal control for level pelvis Baseline: not appropriate to test at eval Goal status: INITIAL   LONG TERM GOALS: Target date: POC date  Meet FOTO goal Baseline:  Goal status: INITIAL  2.  Lunges and stairs with level pelvis and LE control without pain Baseline:  Goal status: INITIAL  3.  Good tolerance to repetitive motion on bike for at least 10 min without pain Baseline:  Goal status: INITIAL  4.  Extend POC Baseline: current POC to 8 weeks- pt wants to return to a very high level of function, futher goals to be set at this POC date  Goal status: INITIAL    PLAN:  PT FREQUENCY: 1-2x/week  PT DURATION: 8 weeks  PLANNED INTERVENTIONS: Therapeutic exercises, Therapeutic activity, Neuromuscular re-education, Balance training, Gait training, Patient/Family education, Self Care, Joint mobilization, Stair training, Aquatic Therapy, Dry Needling, Electrical stimulation, Spinal  mobilization, Cryotherapy, Moist heat, scar mobilization, Taping, Ultrasound, Ionotophoresis 4mg /ml Dexamethasone, Manual therapy, and Re-evaluation.  PLAN FOR NEXT SESSION: per protocol   Zebedee Iba PT, DPT 08/13/22 3:36 PM

## 2022-08-15 ENCOUNTER — Encounter (HOSPITAL_BASED_OUTPATIENT_CLINIC_OR_DEPARTMENT_OTHER): Payer: 59 | Admitting: Physical Therapy

## 2022-08-20 ENCOUNTER — Ambulatory Visit (HOSPITAL_BASED_OUTPATIENT_CLINIC_OR_DEPARTMENT_OTHER): Payer: 59 | Admitting: Physical Therapy

## 2022-08-27 ENCOUNTER — Other Ambulatory Visit (HOSPITAL_BASED_OUTPATIENT_CLINIC_OR_DEPARTMENT_OTHER): Payer: Self-pay | Admitting: Orthopaedic Surgery

## 2022-08-27 ENCOUNTER — Ambulatory Visit (INDEPENDENT_AMBULATORY_CARE_PROVIDER_SITE_OTHER): Payer: 59

## 2022-08-27 ENCOUNTER — Ambulatory Visit (HOSPITAL_BASED_OUTPATIENT_CLINIC_OR_DEPARTMENT_OTHER): Payer: Self-pay | Admitting: Orthopaedic Surgery

## 2022-08-27 ENCOUNTER — Ambulatory Visit (INDEPENDENT_AMBULATORY_CARE_PROVIDER_SITE_OTHER): Payer: 59 | Admitting: Orthopaedic Surgery

## 2022-08-27 ENCOUNTER — Other Ambulatory Visit (HOSPITAL_BASED_OUTPATIENT_CLINIC_OR_DEPARTMENT_OTHER): Payer: Self-pay

## 2022-08-27 DIAGNOSIS — G8929 Other chronic pain: Secondary | ICD-10-CM | POA: Diagnosis not present

## 2022-08-27 DIAGNOSIS — M25511 Pain in right shoulder: Secondary | ICD-10-CM

## 2022-08-27 DIAGNOSIS — M25512 Pain in left shoulder: Secondary | ICD-10-CM

## 2022-08-27 DIAGNOSIS — S43431A Superior glenoid labrum lesion of right shoulder, initial encounter: Secondary | ICD-10-CM

## 2022-08-27 MED ORDER — OXYCODONE HCL 5 MG PO TABS
5.0000 mg | ORAL_TABLET | ORAL | 0 refills | Status: AC | PRN
  Filled 2022-08-27 – 2022-09-24 (×2): qty 5, 1d supply, fill #0

## 2022-08-27 NOTE — Progress Notes (Signed)
Post Operative Evaluation    Procedure/Date of Surgery: Left knee medial meniscal repair   Interval History:   Presents today overall doing extremely well with regard to her left knee meniscal repair.  Today both of her hips as well as particular the right shoulder are her primary concerns.  With regard to her right shoulder, she has been having pain and tenderness about the shoulder joint with resultant pain about the medial scapula as well.  She has been doing aggressive physical therapy for strengthening of her right shoulder although she has been extremely disappointed as she has not been able to rehab this.  She has been doing this quite aggressively with only limited relief.  Denies any history of instability of the shoulder.  Denies any subluxation or dislocation instances.  PMH/PSH/Family History/Social History/Meds/Allergies:    Past Medical History:  Diagnosis Date   ADHD (attention deficit hyperactivity disorder)    Arthritis    bilateral hands/fingers   Complication of anesthesia    Woke up extremely cold after anesthesia. Required warm blankets.   Headache    Migraine   Hypothyroidism    Thyroid disease    Past Surgical History:  Procedure Laterality Date   ceas     c-section   KNEE ARTHROSCOPY WITH MENISCAL REPAIR Left 06/04/2022   Procedure: LEFT KNEE ARTHROSCOPY WITH MEDIAL MENISCAL REPAIR;  Surgeon: Huel Cote, MD;  Location: [facility] OR;  Service: Orthopedics;  Laterality: Left;   REDUCTION MAMMAPLASTY Bilateral    age 47   WISDOM TOOTH EXTRACTION     Social History   Socioeconomic History   Marital status: Married    Spouse name: Not on file   Number of children: Not on file   Years of education: Not on file   Highest education level: Not on file  Occupational History   Not on file  Tobacco Use   Smoking status: Never   Smokeless tobacco: Never  Substance and Sexual Activity   Alcohol use: Yes    Comment:  occasionally   Drug use: No   Sexual activity: Yes  Other Topics Concern   Not on file  Social History Narrative   Not on file   Social Determinants of Health   Financial Resource Strain: Not on file  Food Insecurity: Not on file  Transportation Needs: Not on file  Physical Activity: Not on file  Stress: Not on file  Social Connections: Not on file   No family history on file. Allergies  Allergen Reactions   Food Other (See Comments)    Eggplant-hives/itching/oral swelling   Ciprofloxacin Itching, Nausea And Vomiting and Rash    Hallucinations/fever   Current Outpatient Medications  Medication Sig Dispense Refill   oxyCODONE (ROXICODONE) 5 MG immediate release tablet Take 1 tablet (5 mg total) by mouth every 4 (four) hours as needed for severe pain. 5 tablet 0   ALPRAZolam (XANAX) 0.5 MG tablet Take 0.5 mg by mouth daily as needed for anxiety.     aspirin EC 325 MG tablet Take 1 tablet (325 mg total) by mouth daily. 14 tablet 0   COLLAGEN PO Take 1 tablet by mouth daily in the afternoon. (Gummy)     levocetirizine (XYZAL) 5 MG tablet Take 5 mg by mouth daily in the afternoon.     MAGNESIUM PO Take  1 tablet by mouth in the morning and at bedtime. (Gummy)     MOUNJARO 2.5 MG/0.5ML Pen Inject 2.5 mg into the skin every Monday.     NP THYROID 15 MG tablet Take 15 mg by mouth See admin instructions. 15 mg + 60 mg=75 mg (take 75 mg dose by mouth every other day--alternating with 90 mg dosage)     NP THYROID 90 MG tablet Take 90 mg by mouth every other day. (Take 90 mg dose by mouth every other day--alternating with 75 mg dosage)     oxyCODONE (ROXICODONE) 5 MG immediate release tablet Take 1 tablet (5 mg total) by mouth every 4 (four) hours as needed for severe pain or breakthrough pain. 5 tablet 0   thyroid (ARMOUR) 60 MG tablet Take 60 mg by mouth See admin instructions. 60 mg + 15 mg=75 mg (take 75 mg dose by mouth every other day--alternating with 90 mg dosage)     Triamcinolone  Acetonide (NASACORT AQ NA) Place 1 spray into the nose every evening.     No current facility-administered medications for this visit.   No results found.  Review of Systems:   A ROS was performed including pertinent positives and negatives as documented in the HPI.   Musculoskeletal Exam:      Left knee incisions are well-appearing without erythema or drainage.  Improved quad atrophy and tone.. -2 - 135.  Swelling is mild.  Musculoskeletal Exam    Inspection Right Left  Skin No atrophy with mild winging No atrophy or winging  Palpation    Tenderness Glenohumeral None  Range of Motion    Flexion (passive) 170 170  Flexion (active) 170 170  Abduction 170 170  ER at the side 70 70  Can reach behind back to T12 T12  Strength     Full with pain none  Special Tests    Pseudoparalytic No No  Neurologic    Fires PIN, radial, median, ulnar, musculocutaneous, axillary, suprascapular, long thoracic, and spinal accessory innervated muscles. No abnormal sensibility  Vascular/Lymphatic    Radial Pulse 2+ 2+  Cervical Exam    Patient has symmetric cervical range of motion with negative Spurling's test.  Special Test: 2+ anterior load shift with pain     Imaging:    X-ray 3 view right shoulder: Normal  MRI right shoulder: Anterior labral tear  I personally reviewed and interpreted the radiographs.   Assessment:   12 weeks status post left knee medial meniscal repair overall doing well.  At today's visit we did discuss both her right shoulder as well as bilateral hips.  Unfortunately with regard to the right shoulder she does have evidence of labral tear and I did review her shoulder MRI which does show an anterior labral tear.  She does not have frank instability although I do believe that this symptomatic labral tear is progressive and worsening her scapular type symptoms as well as trapezial pain.  Overall I do believe that she would benefit from arthroscopic labral repair  given the fact that she has had so much physical therapy on the right shoulder with very limited ability to rehab and strengthening.  We did also discuss the possibility of bilateral instability of the hips although this time her right shoulder is the majority of her problems.  We did discuss limitations and rehab associated with right shoulder.  She would like to proceed with right shoulder arthroscopy and labral repair  Plan :    -And for  right shoulder arthroscopy with labral repair   After a lengthy discussion of treatment options, including risks, benefits, alternatives, complications of surgical and nonsurgical conservative options, the patient elected surgical repair.   The patient  is aware of the material risks  and complications including, but not limited to injury to adjacent structures, neurovascular injury, infection, numbness, bleeding, implant failure, thermal burns, stiffness, persistent pain, failure to heal, disease transmission from allograft, need for further surgery, dislocation, anesthetic risks, blood clots, risks of death,and others. The probabilities of surgical success and failure discussed with patient given their particular co-morbidities.The time and nature of expected rehabilitation and recovery was discussed.The patient's questions were all answered preoperatively.  No barriers to understanding were noted. I explained the natural history of the disease process and Rx rationale.  I explained to the patient what I considered to be reasonable expectations given their personal situation.  The final treatment plan was arrived at through a shared patient decision making process model.       I personally saw and evaluated the patient, and participated in the management and treatment plan.  Huel Cote, MD Attending Physician, Orthopedic Surgery  This document was dictated using Dragon voice recognition software. A reasonable attempt at proof reading has been made to  minimize errors.

## 2022-08-30 ENCOUNTER — Encounter (HOSPITAL_BASED_OUTPATIENT_CLINIC_OR_DEPARTMENT_OTHER): Payer: Self-pay | Admitting: Orthopaedic Surgery

## 2022-08-30 ENCOUNTER — Encounter (HOSPITAL_BASED_OUTPATIENT_CLINIC_OR_DEPARTMENT_OTHER): Payer: Self-pay

## 2022-09-02 ENCOUNTER — Other Ambulatory Visit (HOSPITAL_BASED_OUTPATIENT_CLINIC_OR_DEPARTMENT_OTHER): Payer: Self-pay

## 2022-09-03 ENCOUNTER — Ambulatory Visit (HOSPITAL_BASED_OUTPATIENT_CLINIC_OR_DEPARTMENT_OTHER): Payer: 59 | Attending: Orthopaedic Surgery | Admitting: Physical Therapy

## 2022-09-03 ENCOUNTER — Other Ambulatory Visit (HOSPITAL_BASED_OUTPATIENT_CLINIC_OR_DEPARTMENT_OTHER): Payer: Self-pay

## 2022-09-03 ENCOUNTER — Encounter (HOSPITAL_BASED_OUTPATIENT_CLINIC_OR_DEPARTMENT_OTHER): Payer: Self-pay | Admitting: Physical Therapy

## 2022-09-03 DIAGNOSIS — M25562 Pain in left knee: Secondary | ICD-10-CM | POA: Insufficient documentation

## 2022-09-03 DIAGNOSIS — R262 Difficulty in walking, not elsewhere classified: Secondary | ICD-10-CM | POA: Insufficient documentation

## 2022-09-03 DIAGNOSIS — M6281 Muscle weakness (generalized): Secondary | ICD-10-CM | POA: Insufficient documentation

## 2022-09-03 DIAGNOSIS — M25511 Pain in right shoulder: Secondary | ICD-10-CM | POA: Diagnosis present

## 2022-09-03 DIAGNOSIS — S43431A Superior glenoid labrum lesion of right shoulder, initial encounter: Secondary | ICD-10-CM | POA: Diagnosis present

## 2022-09-03 DIAGNOSIS — M25611 Stiffness of right shoulder, not elsewhere classified: Secondary | ICD-10-CM | POA: Diagnosis present

## 2022-09-03 NOTE — Therapy (Signed)
OUTPATIENT PHYSICAL THERAPY TREATMENT   Patient Name: Crystal Parks MRN: [id number] DOB:03/25/1975, 47 y.o., female Today's Date: 09/03/2022  END OF SESSION:  PT End of Session - 09/03/22 1518     Visit Number 9    Number of Visits 17    Date for PT Re-Evaluation 08/03/22    Authorization Type [employer]    PT Start Time 1448    PT Stop Time 1527    PT Time Calculation (min) 39 min    Activity Tolerance Patient tolerated treatment well    Behavior During Therapy WFL for tasks assessed/performed                   Past Medical History:  Diagnosis Date   ADHD (attention deficit hyperactivity disorder)    Arthritis    bilateral hands/fingers   Complication of anesthesia    Woke up extremely cold after anesthesia. Required warm blankets.   Headache    Migraine   Hypothyroidism    Thyroid disease    Past Surgical History:  Procedure Laterality Date   ceas     c-section   KNEE ARTHROSCOPY WITH MENISCAL REPAIR Left 06/04/2022   Procedure: LEFT KNEE ARTHROSCOPY WITH MEDIAL MENISCAL REPAIR;  Surgeon: Huel Cote, MD;  Location: [facility] OR;  Service: Orthopedics;  Laterality: Left;   REDUCTION MAMMAPLASTY Bilateral    age 16   WISDOM TOOTH EXTRACTION     Patient Active Problem List   Diagnosis Date Noted   Acute medial meniscus tear of left knee 06/04/2022   Inappropriate sinus tachycardia 06/30/2018   Palpitations 06/30/2018   Acute cystitis with hematuria 11/21/2016   Hypothyroidism 11/29/2015   Impaired fasting glucose 11/29/2015   Myalgia 11/29/2015   Female pattern alopecia 11/15/2011   Telogen effluvium 11/15/2011   ALLERGIC RHINITIS 10/18/2009   ASTHMA 10/18/2009   Asthma 10/18/2009   REFERRING PROVIDER: Steward Drone, MD  REFERRING DIAG:  S83.242A (ICD-10-CM) - Acute medial meniscus tear of left knee, initial encounter    Post op Lt medial meniscus repair  Rationale for Evaluation and Treatment: Rehabilitation  THERAPY DIAG:  Acute pain of left  knee  Difficulty in walking, not elsewhere classified  ONSET DATE: DOS 06/04/22  Days since surgery: 91    SUBJECTIVE:                                                                                                                                                                                           SUBJECTIVE STATEMENT:  Pt has R shoulder labral tearing as well as bilateral hip dysplasia. Pt states the L knee feels better than before and stronger  than previous session. Has limp with hilly terrain after a half a mile.   PERTINENT HISTORY:  hypothyroidism  PAIN:  Are you having pain? No: NPRS scale: 0/10 Pain location: Lt knee, mostly post Pain description: stiff, sore Aggravating factors: moving from straight to angled Relieving factors: ice, walking improved quick pain after moving  PRECAUTIONS: None  WEIGHT BEARING RESTRICTIONS: Yes POSTOPERATIVE PLAN: She will be weightbearing as tolerated.  I would like her to avoid no flexion beyond 90 degrees for the first 2 weeks.  She will be given crutches for assistance with weightbearing.  I will see her back in 2 weeks for suture removal  FALLS:  Has patient fallen in last 6 months? Yes. Number of falls 1  LIVING ENVIRONMENT: There is a finished basement but only go down there if doing laundry  OCCUPATION: mental health therapist  PLOF: Independent  PATIENT GOALS: grappling   OBJECTIVE:   PATIENT SURVEYS:  FOTO EVAL: 29  Active   Left 5/15 Left  5/29 L 6/24  Hip flexion      Hip extension      Hip abduction      Hip adduction      Hip internal rotation      Hip external rotation      Knee flexion  90 113 115  Knee extension  0 -3 1  Ankle dorsiflexion      Ankle plantarflexion      Ankle inversion      Ankle eversion       (Blank rows = not tested)    TREATMENT:    8/5  Exercises - Side Stepping with Resistance at Ankles  - 1 x daily - 3 x weekly - 1 sets - 3 reps - 30ft hold - Squat on Flat Side  of BOSU  - 1 x daily - 3 x weekly - 2 sets - 20 reps - Lateral Step Down  - 1 x daily - 3 x weekly - 3 sets - 5 reps - Eccentric Knee Extension with Weight Machine  - 1 x daily - 3 x weekly - 4 sets - 6 reps - 5 hold - [place] Split Squat  - 1 x daily - 3 x weekly - 3 sets - 8 reps - Hamstring Curl with Weight Machine  - 1 x daily - 3 x weekly - 3 sets - 8 reps   HEP, POC with upcoming surgeries       7/15  STM L quad and HS   Recumbent bike L4.5 6 min   Prone quad stretch 30s 3x Self foam roller to quad, active release 5 min   Reverse nordic 3s 2x10  Self pain management, massage therapy, recovery    7/8 Recumbent bike L4.5 6 min   SL leg press 3x12 75lbs  Bosu squats 2x20 4" box lateral step down 3x5 Wall iso lunge hold 10s 4x Staggered stance box squat to chair height 3x8  6/24  L knee patellar mobs in all 4 directions grade III  STM L vastus lateralis, L biceps fem  SL bridge 3x8 Sidestepping GTB at ankles 27ft x3 laps  Quad burner 15s 4x  5 lb LAQ 2x10  BFR mini squat 80% occlusion 205 mmHg smart pressure (30, 15, 15, 15) 30s rest                                 PATIENT EDUCATION:  Education details: surgical precautions, anatomy, DOMS expectations, muscle firing, HEP, POC  Person educated: Patient and Spouse Education method: Explanation, Demonstration, Tactile cues, Verbal cues, and Handouts Education comprehension: verbalized understanding, returned demonstration, verbal cues required, tactile cues required, and needs further education  HOME EXERCISE PROGRAM: [phone]    ASSESSMENT:  CLINICAL IMPRESSION: Pt with upcoming surgery likely to affect current left knee plan of care.  Patient diagnosed with bilateral hip dysplasia as well as right shoulder labral tear that requires surgical intervention.  Patient exercise session focused on discussing plan of care moving forward as well as progression of left lower extremity exercise.  If able, try  to progress to single-leg stability exercise implant surface exercise as a as tolerate.  However, should patient have shoulder surgery within the next 2 to 3 weeks continue with right shoulder protocol in conjunction with left lower extremity rehab.  Patient presents with no pain of the left lower extremity today and was able to progress intensity to include overload eccentric's.  Home exercises updated and provided.  Plan for frequency of  knee visits to be modified based on R shoulder surgery. Patient will benefit from continued skilled therapy in order to address functional deficits and improve left knee strength and range of motion for return to prior level of function.  REHAB POTENTIAL: Good  CLINICAL DECISION MAKING: Stable/uncomplicated  EVALUATION COMPLEXITY: Low   GOALS:  SHORT TERM GOALS: Target date: 5/31  Gait pattern WFL without AD, pain <2/10 Baseline: WBAT with bil axillary crutches at eval Goal status: INITIAL  2.  Knee ROM 0-120 Baseline: limit to 90 in first 2 weeks Goal status: INITIAL  3.  SLS with proximal control for level pelvis Baseline: not appropriate to test at eval Goal status: INITIAL   LONG TERM GOALS: Target date: POC date  Meet FOTO goal Baseline:  Goal status: INITIAL  2.  Lunges and stairs with level pelvis and LE control without pain Baseline:  Goal status: INITIAL  3.  Good tolerance to repetitive motion on bike for at least 10 min without pain Baseline:  Goal status: INITIAL  4.  Extend POC Baseline: current POC to 8 weeks- pt wants to return to a very high level of function, futher goals to be set at this POC date  Goal status: INITIAL    PLAN:  PT FREQUENCY: 1-2x/week  PT DURATION: 8 weeks  PLANNED INTERVENTIONS: Therapeutic exercises, Therapeutic activity, Neuromuscular re-education, Balance training, Gait training, Patient/Family education, Self Care, Joint mobilization, Stair training, Aquatic Therapy, Dry Needling,  Electrical stimulation, Spinal mobilization, Cryotherapy, Moist heat, scar mobilization, Taping, Ultrasound, Ionotophoresis 4mg /ml Dexamethasone, Manual therapy, and Re-evaluation.  PLAN FOR NEXT SESSION: per protocol   Zebedee Iba PT, DPT 09/03/22 3:33 PM

## 2022-09-06 NOTE — Telephone Encounter (Signed)
I have not received a surgery sheet on this patient.

## 2022-09-06 NOTE — Telephone Encounter (Signed)
I spoke with the patient. Her surgery is scheduled for 09/24/22.

## 2022-09-10 ENCOUNTER — Encounter (HOSPITAL_BASED_OUTPATIENT_CLINIC_OR_DEPARTMENT_OTHER): Payer: Self-pay | Admitting: Physical Therapy

## 2022-09-10 ENCOUNTER — Ambulatory Visit (HOSPITAL_BASED_OUTPATIENT_CLINIC_OR_DEPARTMENT_OTHER): Payer: 59 | Admitting: Physical Therapy

## 2022-09-10 DIAGNOSIS — R262 Difficulty in walking, not elsewhere classified: Secondary | ICD-10-CM

## 2022-09-10 DIAGNOSIS — M25562 Pain in left knee: Secondary | ICD-10-CM

## 2022-09-10 NOTE — Therapy (Signed)
OUTPATIENT PHYSICAL THERAPY TREATMENT   Patient Name: Crystal Parks MRN: [id number] DOB:1976/01/01, 47 y.o., female Today's Date: 09/10/2022  END OF SESSION:          Past Medical History:  Diagnosis Date   ADHD (attention deficit hyperactivity disorder)    Arthritis    bilateral hands/fingers   Complication of anesthesia    Woke up extremely cold after anesthesia. Required warm blankets.   Headache    Migraine   Hypothyroidism    Thyroid disease    Past Surgical History:  Procedure Laterality Date   ceas     c-section   KNEE ARTHROSCOPY WITH MENISCAL REPAIR Left 06/04/2022   Procedure: LEFT KNEE ARTHROSCOPY WITH MEDIAL MENISCAL REPAIR;  Surgeon: Huel Cote, MD;  Location: [facility] OR;  Service: Orthopedics;  Laterality: Left;   REDUCTION MAMMAPLASTY Bilateral    age 47   WISDOM TOOTH EXTRACTION     Patient Active Problem List   Diagnosis Date Noted   Acute medial meniscus tear of left knee 06/04/2022   Inappropriate sinus tachycardia 06/30/2018   Palpitations 06/30/2018   Acute cystitis with hematuria 11/21/2016   Hypothyroidism 11/29/2015   Impaired fasting glucose 11/29/2015   Myalgia 11/29/2015   Female pattern alopecia 11/15/2011   Telogen effluvium 11/15/2011   ALLERGIC RHINITIS 10/18/2009   ASTHMA 10/18/2009   Asthma 10/18/2009   REFERRING PROVIDER: Steward Drone, MD  REFERRING DIAG:  S83.242A (ICD-10-CM) - Acute medial meniscus tear of left knee, initial encounter    Post op Lt medial meniscus repair  Rationale for Evaluation and Treatment: Rehabilitation  THERAPY DIAG:  No diagnosis found.  ONSET DATE: DOS 06/04/22  Days since surgery: 98    SUBJECTIVE:                                                                                                                                                                                           SUBJECTIVE STATEMENT:  Pt has R shoulder labral tearing as well as bilateral hip dysplasia. Pt states the L  knee feels better than before and stronger than previous session. Has limp with hilly terrain after a half a mile.   PERTINENT HISTORY:  hypothyroidism  PAIN:  Are you having pain? No: NPRS scale: 0/10 Pain location: Lt knee, mostly post Pain description: stiff, sore Aggravating factors: moving from straight to angled Relieving factors: ice, walking improved quick pain after moving  PRECAUTIONS: None  WEIGHT BEARING RESTRICTIONS: Yes POSTOPERATIVE PLAN: She will be weightbearing as tolerated.  I would like her to avoid no flexion beyond 90 degrees for the first 2 weeks.  She will be given crutches for  assistance with weightbearing.  I will see her back in 2 weeks for suture removal  FALLS:  Has patient fallen in last 6 months? Yes. Number of falls 1  LIVING ENVIRONMENT: There is a finished basement but only go down there if doing laundry  OCCUPATION: mental health therapist  PLOF: Independent  PATIENT GOALS: grappling   OBJECTIVE:   PATIENT SURVEYS:  FOTO EVAL: 29  Active   Left 5/15 Left  5/29 L 6/24  Hip flexion      Hip extension      Hip abduction      Hip adduction      Hip internal rotation      Hip external rotation      Knee flexion  90 113 115  Knee extension  0 -3 1  Ankle dorsiflexion      Ankle plantarflexion      Ankle inversion      Ankle eversion       (Blank rows = not tested)   LOWER EXTREMITY MMT:    MMT Right eval Left eval  Hip flexion 41.3 41.8  Hip extension    Hip abduction 45.7 50.3  Hip adduction    Hip internal rotation    Hip external rotation    Knee flexion    Knee extension 49.1 56.1  Ankle dorsiflexion    Ankle plantarflexion    Ankle inversion    Ankle eversion     (Blank rows = not tested)   TREATMENT:   8/12 Ex bike 6 min L4   Reviewed strength and FOTO measurements   SLR x20 3lbs  SL SLR 3 lbs    Quadruped:   Rock x15  Alt /LE x10  Alt UE/ LE x10   Side to side hops x20  Hops on air-ex x20       8/5  Exercises - Side Stepping with Resistance at Ankles  - 1 x daily - 3 x weekly - 1 sets - 3 reps - 48ft hold - Squat on Flat Side of BOSU  - 1 x daily - 3 x weekly - 2 sets - 20 reps - Lateral Step Down  - 1 x daily - 3 x weekly - 3 sets - 5 reps - Eccentric Knee Extension with Weight Machine  - 1 x daily - 3 x weekly - 4 sets - 6 reps - 5 hold - [place] Split Squat  - 1 x daily - 3 x weekly - 3 sets - 8 reps - Hamstring Curl with Weight Machine  - 1 x daily - 3 x weekly - 3 sets - 8 reps   HEP, POC with upcoming surgeries       7/15  STM L quad and HS   Recumbent bike L4.5 6 min   Prone quad stretch 30s 3x Self foam roller to quad, active release 5 min   Reverse nordic 3s 2x10  Self pain management, massage therapy, recovery    7/8 Recumbent bike L4.5 6 min   SL leg press 3x12 75lbs  Bosu squats 2x20 4" box lateral step down 3x5 Wall iso lunge hold 10s 4x Staggered stance box squat to chair height 3x8  6/24  L knee patellar mobs in all 4 directions grade III  STM L vastus lateralis, L biceps fem  SL bridge 3x8 Sidestepping GTB at ankles 8ft x3 laps  Quad burner 15s 4x  5 lb LAQ 2x10  BFR mini squat 80% occlusion 205 mmHg smart pressure (  30, 15, 15, 15) 30s rest                                 PATIENT EDUCATION:  Education details: surgical precautions, anatomy, DOMS expectations, muscle firing, HEP, POC  Person educated: Patient and Spouse Education method: Explanation, Demonstration, Tactile cues, Verbal cues, and Handouts Education comprehension: verbalized understanding, returned demonstration, verbal cues required, tactile cues required, and needs further education  HOME EXERCISE PROGRAM: [phone]    ASSESSMENT:  CLINICAL IMPRESSION: Patient tolerated treatment well.  Therapy performed reassessment on her today.  She is more than 80% strength compared to her contralateral side.  Her Foto score is met the goal.  She will have 1  more visit for her knee.  At this time we will likely discharge her knee and she will come back for her right shoulder.  Today we worked on sport specific rehab.  She wants to be able to do jujitsu again.  We worked on a kneeling progression.  She tolerated well.  She had no significant increase in pain.  We also worked on landing on the left side using the [employer].  She had no significant pain with this either.  Will continue to progress as tolerated.  REHAB POTENTIAL: Good  CLINICAL DECISION MAKING: Stable/uncomplicated  EVALUATION COMPLEXITY: Low   GOALS:  SHORT TERM GOALS: Target date: 5/31  Gait pattern WFL without AD, pain <2/10 Baseline: WBAT with bil axillary crutches at eval Goal status: INITIAL  2.  Knee ROM 0-120 Baseline: limit to 90 in first 2 weeks Goal status: INITIAL  3.  SLS with proximal control for level pelvis Baseline: not appropriate to test at eval Goal status: INITIAL   LONG TERM GOALS: Target date: POC date  Meet FOTO goal Baseline:  Goal status: INITIAL  2.  Lunges and stairs with level pelvis and LE control without pain Baseline:  Goal status: INITIAL  3.  Good tolerance to repetitive motion on bike for at least 10 min without pain Baseline:  Goal status: INITIAL  4.  Extend POC Baseline: current POC to 8 weeks- pt wants to return to a very high level of function, futher goals to be set at this POC date  Goal status: INITIAL    PLAN:  PT FREQUENCY: 1-2x/week  PT DURATION: 8 weeks  PLANNED INTERVENTIONS: Therapeutic exercises, Therapeutic activity, Neuromuscular re-education, Balance training, Gait training, Patient/Family education, Self Care, Joint mobilization, Stair training, Aquatic Therapy, Dry Needling, Electrical stimulation, Spinal mobilization, Cryotherapy, Moist heat, scar mobilization, Taping, Ultrasound, Ionotophoresis 4mg /ml Dexamethasone, Manual therapy, and Re-evaluation.  PLAN FOR NEXT SESSION: per protocol  Lorayne Bender  PT DPT   09/10/22 1:57 PM

## 2022-09-17 ENCOUNTER — Ambulatory Visit (HOSPITAL_BASED_OUTPATIENT_CLINIC_OR_DEPARTMENT_OTHER): Payer: 59 | Admitting: Physical Therapy

## 2022-09-17 ENCOUNTER — Encounter (HOSPITAL_BASED_OUTPATIENT_CLINIC_OR_DEPARTMENT_OTHER): Payer: Self-pay | Admitting: Physical Therapy

## 2022-09-17 DIAGNOSIS — M25562 Pain in left knee: Secondary | ICD-10-CM | POA: Diagnosis not present

## 2022-09-17 DIAGNOSIS — R262 Difficulty in walking, not elsewhere classified: Secondary | ICD-10-CM

## 2022-09-17 NOTE — Therapy (Signed)
OUTPATIENT PHYSICAL THERAPY TREATMENT  PHYSICAL THERAPY DISCHARGE SUMMARY  Visits from Start of Care: 11  Plan: Patient agrees to discharge.  Patient goals were met. Patient is being discharged due to meeting the stated rehab goals and preparing for upcoming shoulder surgery.         Patient Name: Crystal Parks MRN: [id number] DOB:1976/01/24, 47 y.o., female Today's Date: 09/17/2022  END OF SESSION:  PT End of Session - 09/17/22 1549     Visit Number 11    Number of Visits 17    Date for PT Re-Evaluation 11/05/22    Authorization Type [employer]    PT Start Time 1449    PT Stop Time 1530    PT Time Calculation (min) 41 min    Activity Tolerance Patient tolerated treatment well    Behavior During Therapy WFL for tasks assessed/performed                    Past Medical History:  Diagnosis Date   ADHD (attention deficit hyperactivity disorder)    Arthritis    bilateral hands/fingers   Complication of anesthesia    Woke up extremely cold after anesthesia. Required warm blankets.   Headache    Migraine   Hypothyroidism    Thyroid disease    Past Surgical History:  Procedure Laterality Date   ceas     c-section   KNEE ARTHROSCOPY WITH MENISCAL REPAIR Left 06/04/2022   Procedure: LEFT KNEE ARTHROSCOPY WITH MEDIAL MENISCAL REPAIR;  Surgeon: Huel Cote, MD;  Location: [facility] OR;  Service: Orthopedics;  Laterality: Left;   REDUCTION MAMMAPLASTY Bilateral    age 47   WISDOM TOOTH EXTRACTION     Patient Active Problem List   Diagnosis Date Noted   Acute medial meniscus tear of left knee 06/04/2022   Inappropriate sinus tachycardia 06/30/2018   Palpitations 06/30/2018   Acute cystitis with hematuria 11/21/2016   Hypothyroidism 11/29/2015   Impaired fasting glucose 11/29/2015   Myalgia 11/29/2015   Female pattern alopecia 11/15/2011   Telogen effluvium 11/15/2011   ALLERGIC RHINITIS 10/18/2009   ASTHMA 10/18/2009   Asthma 10/18/2009   REFERRING PROVIDER:  Steward Drone, MD  REFERRING DIAG:  S83.242A (ICD-10-CM) - Acute medial meniscus tear of left knee, initial encounter    Post op Lt medial meniscus repair  Rationale for Evaluation and Treatment: Rehabilitation  THERAPY DIAG:  Difficulty in walking, not elsewhere classified  Acute pain of left knee  ONSET DATE: DOS 06/04/22  Days since surgery: 105    SUBJECTIVE:  SUBJECTIVE STATEMENT:   Shoulder surgery scheduled for 8/26.   Pt has no issues from last session but SL hopping is uncomfortable. Movement   PERTINENT HISTORY:  hypothyroidism  PAIN:  Are you having pain? No: NPRS scale: 0/10 Pain location: Lt knee, mostly post Pain description: stiff, sore Aggravating factors: moving from straight to angled Relieving factors: ice, walking improved quick pain after moving  PRECAUTIONS: None  WEIGHT BEARING RESTRICTIONS: Yes POSTOPERATIVE PLAN: She will be weightbearing as tolerated.  I would like her to avoid no flexion beyond 90 degrees for the first 2 weeks.  She will be given crutches for assistance with weightbearing.  I will see her back in 2 weeks for suture removal  FALLS:  Has patient fallen in last 6 months? Yes. Number of falls 1  LIVING ENVIRONMENT: There is a finished basement but only go down there if doing laundry  OCCUPATION: mental health therapist  PLOF: Independent  PATIENT GOALS: grappling   OBJECTIVE:   PATIENT SURVEYS:  FOTO EVAL: 29  FOTO 82% 8/12  Active   Left 5/15 Left  5/29 L 8/19  Hip flexion      Hip extension      Hip abduction      Hip adduction      Hip internal rotation      Hip external rotation      Knee flexion  90 113 130  Knee extension  0 -3 4  Ankle dorsiflexion      Ankle plantarflexion      Ankle inversion      Ankle eversion        (Blank rows = not tested)   LOWER EXTREMITY MMT:    MMT Right  Left   Hip flexion 41.3 41.8  Hip extension    Hip abduction 45.7 50.3  Hip adduction    Hip internal rotation    Hip external rotation    Knee flexion    Knee extension 49.1 56.1  Ankle dorsiflexion    Ankle plantarflexion    Ankle inversion    Ankle eversion     (Blank rows = not tested)   TREATMENT:   8/19   Exercises - Side Stepping with Resistance at Ankles  - 1 x daily - 3 x weekly - 1 sets - 3 reps - 78ft hold - Lateral Step Down  - 1 x daily - 3 x weekly - 3 sets - 5 reps - Eccentric Knee Extension with Weight Machine  - 1 x daily - 3 x weekly - 4 sets - 6 reps - 5 hold - [place] Split Squat  - 1 x daily - 3 x weekly - 3 sets - 8 reps - Hamstring Curl with Weight Machine  - 1 x daily - 3 x weekly - 3 sets - 8 reps - SL Leg Press  - 1 x daily - 3 x weekly - 3 sets - 10 reps - Pistol Squat  - 1 x daily - 3 x weekly - 3 sets - 5 reps  Edu re R shoulder shoulder and effect on L knee strengthening, recovery, precautions, progressions/regressions  8/12 Ex bike 6 min L4   Reviewed strength and FOTO measurements   SLR x20 3lbs  SL SLR 3 lbs    Quadruped:   Rock x15  Alt /LE x10  Alt UE/ LE x10   Side to side hops x20  Hops on air-ex x20      8/5  Exercises - Side  Stepping with Resistance at Ankles  - 1 x daily - 3 x weekly - 1 sets - 3 reps - 60ft hold - Squat on Flat Side of BOSU  - 1 x daily - 3 x weekly - 2 sets - 20 reps - Lateral Step Down  - 1 x daily - 3 x weekly - 3 sets - 5 reps - Eccentric Knee Extension with Weight Machine  - 1 x daily - 3 x weekly - 4 sets - 6 reps - 5 hold - [place] Split Squat  - 1 x daily - 3 x weekly - 3 sets - 8 reps - Hamstring Curl with Weight Machine  - 1 x daily - 3 x weekly - 3 sets - 8 reps   HEP, POC with upcoming surgeries       7/15  STM L quad and HS   Recumbent bike L4.5 6 min   Prone quad stretch 30s 3x Self foam roller to  quad, active release 5 min   Reverse nordic 3s 2x10  Self pain management, massage therapy, recovery    7/8 Recumbent bike L4.5 6 min   SL leg press 3x12 75lbs  Bosu squats 2x20 4" box lateral step down 3x5 Wall iso lunge hold 10s 4x Staggered stance box squat to chair height 3x8  6/24  L knee patellar mobs in all 4 directions grade III  STM L vastus lateralis, L biceps fem  SL bridge 3x8 Sidestepping GTB at ankles 36ft x3 laps  Quad burner 15s 4x  5 lb LAQ 2x10  BFR mini squat 80% occlusion 205 mmHg smart pressure (30, 15, 15, 15) 30s rest                                 PATIENT EDUCATION:  Education details: surgical precautions, anatomy, DOMS expectations, muscle firing, HEP, POC  Person educated: Patient and Spouse Education method: Explanation, Demonstration, Tactile cues, Verbal cues, and Handouts Education comprehension: verbalized understanding, returned demonstration, verbal cues required, tactile cues required, and needs further education  HOME EXERCISE PROGRAM: [phone]    ASSESSMENT:  CLINICAL IMPRESSION: Pt L knee case d/c'd today with discussion of self progression of exercise due to upcoming R shoulder surgery. Pt does still  have L LEstrength deficits with and decreased tolerance to plyometric activity, especially on SL. Pt has met current PT goals but will still require future therapy for return to exercise and recreation. HEP discussed and surgical expectations discussed with pt and how to modify movements for the L LE. D/C this case and plan for pt to return to therapy 8/29 for post-op visit.   REHAB POTENTIAL: Good  CLINICAL DECISION MAKING: Stable/uncomplicated  EVALUATION COMPLEXITY: Low   GOALS:  SHORT TERM GOALS: Target date: 5/31  Gait pattern WFL without AD, pain <2/10 Baseline: WBAT with bil axillary crutches at eval Goal status: MET  2.  Knee ROM 0-120 Baseline: limit to 90 in first 2 weeks Goal status: MET  3.  SLS with  proximal control for level pelvis Baseline: not appropriate to test at eval Goal status: MET   LONG TERM GOALS: Target date: POC date  Meet FOTO goal Baseline:  Goal status: MET  2.  Lunges and stairs with level pelvis and LE control without pain Baseline:  Goal status: MET  3.  Good tolerance to repetitive motion on bike for at least 10 min without pain Baseline:  Goal status:  INITIAL  4.  Extend POC Baseline: current POC to 8 weeks- pt wants to return to a very high level of function, futher goals to be set at this POC date  Goal status: MET    PLAN:  PT FREQUENCY: 1-2x/week  PT DURATION: 8 weeks  PLANNED INTERVENTIONS: Therapeutic exercises, Therapeutic activity, Neuromuscular re-education, Balance training, Gait training, Patient/Family education, Self Care, Joint mobilization, Stair training, Aquatic Therapy, Dry Needling, Electrical stimulation, Spinal mobilization, Cryotherapy, Moist heat, scar mobilization, Taping, Ultrasound, Ionotophoresis 4mg /ml Dexamethasone, Manual therapy, and Re-evaluation.     Zebedee Iba PT, DPT 09/17/22 3:56 PM

## 2022-09-24 ENCOUNTER — Encounter (HOSPITAL_BASED_OUTPATIENT_CLINIC_OR_DEPARTMENT_OTHER): Payer: 59 | Admitting: Physical Therapy

## 2022-09-24 ENCOUNTER — Encounter (HOSPITAL_BASED_OUTPATIENT_CLINIC_OR_DEPARTMENT_OTHER): Payer: Self-pay | Admitting: Orthopaedic Surgery

## 2022-09-24 ENCOUNTER — Other Ambulatory Visit (HOSPITAL_BASED_OUTPATIENT_CLINIC_OR_DEPARTMENT_OTHER): Payer: Self-pay

## 2022-09-24 DIAGNOSIS — S43431A Superior glenoid labrum lesion of right shoulder, initial encounter: Secondary | ICD-10-CM | POA: Diagnosis not present

## 2022-09-24 NOTE — Progress Notes (Signed)
Date of Surgery: 09/24/2022  INDICATIONS: Crystal Parks is a 47 y.o.-year-old female with right shoulder anterior labral tear with persistent pain.  The risk and benefits of the procedure were discussed in detail and documented in the pre-operative evaluation.   PREOPERATIVE DIAGNOSIS: 1.  Right shoulder anterior labral tear  POSTOPERATIVE DIAGNOSIS: Same.  PROCEDURE: 1.  Right shoulder anterior labral repair 2.  Right shoulder limited debridement  SURGEON: Benancio Deeds MD  ASSISTANT: Kerby Less, ATC  ANESTHESIA:  general  IV FLUIDS AND URINE: See anesthesia record.  ANTIBIOTICS: Ancef  ESTIMATED BLOOD LOSS: 5 mL.  IMPLANTS:  * No surgical log found *  DRAINS: None  CULTURES: None  COMPLICATIONS: none  DESCRIPTION OF PROCEDURE:  Examination under anesthesia revealed forward elevation of 165 degrees.  In abduction, there was 90 degrees of external rotation and 70 degrees of internal rotation.  With the arm at the side, there was 70 degrees of external rotation.  There is a 2+ anterior load shift and a 1+ posterior load shift with palpable click as the humerus moved over the glenoid.  Arthroscopic findings demonstrated:  Glenoid cartilage: Normal Humeral head: Normal Labrum:  labral tear from 3 o'clock to 6 o'clock Biceps insertion: Intact Biceps tendon: Intact Subscapularis insertion: Normal Rotator cuff: Normal  The patient was identified in the preoperative holding area.  The correct site was marked according to universal protocol.  Anesthesia performed an interscalene nerve block.  Ancef was given 1 hour prior to skin incision.  The patient was subsequently taken back to the operating room.  The patient was prepped and draped and positioned in the lateral position.  All bony prominences were padded.  Final timeout was performed.  Standard posterior, anterior and anterosuperolateral portals were utilized. The posterior portal was created with an 11-blade and the  arthroscope introduced into the glenohumeral joint.  A full diagnostic arthroscopy was performed as described above.  A low anterior portal just above the rolled border of the subscapularis was identified with a spinal needle, and then instrumenting cannula was placed.  An anterosuperolateral viewing portal was localized with a spinal needle just posterior to the biceps tendon, and the arthroscope was transferred to this portal.  A posterior portal was also placed for instrumentation.  First, I directed my attention to preparation of the glenoid, labrum and capsule for repair.  The elevator was used to elevate off the injured labrum from the glenoid rim.  A shaver was subsequently introduced and used on forward in order to create bleeding bony bed for the labrum to heal back to.  Debridement was performed of the posterior and inferior labrum with combination of electrocautery and shaver.  Next, I sequentially repaired the capsule and labrum from the 3:00 position to the 6:00 position with a total of 3 anchors.  These were all suture knotless anchors as noted above.  Anchors were placed at the 3:30, 4:30, 5:30 positions.  At each location, a pilot hole was drilled, the anchor inserted and deployed.  Then, one limb of suture was shuttled around the labrum and capsule using a suture lasso, taking care to provide both medial to lateral and inferior to superior shift of the tissues.  This was subsequently fed into the knotless mechanism and tensioned.  This was done sequentially for all additional anchors.  Once completed, the labrum was restored to an anatomic position, and tension was restored to both bands of the IGHL.  The inferior capsular volume was normalized and the  humeral head was centered on the glenoid.   All instruments were removed, fluid was evacuated, and the arthroscopy portals were closed with 3-0 nylon.  A sterile dressing was applied with Xeroform, gauze, ABD and Medipore tape followed by a  Iceman device and a sling with an abduction pillow.    The patient awoke from anesthesia without difficulty and was transferred to PACU in stable condition.    POSTOPERATIVE PLAN: I will see her back in 2 weeks for suture removal.  She will be nonweightbearing until she begins physical therapy.  She will begin labral repair protocol.  Should be on aspirin for 2 weeks prevent blood clots.  Benancio Deeds, MD 4:43 PM

## 2022-09-27 ENCOUNTER — Encounter (HOSPITAL_BASED_OUTPATIENT_CLINIC_OR_DEPARTMENT_OTHER): Payer: Self-pay | Admitting: Physical Therapy

## 2022-09-27 ENCOUNTER — Ambulatory Visit (HOSPITAL_BASED_OUTPATIENT_CLINIC_OR_DEPARTMENT_OTHER): Payer: 59 | Admitting: Physical Therapy

## 2022-09-27 DIAGNOSIS — M25562 Pain in left knee: Secondary | ICD-10-CM | POA: Diagnosis not present

## 2022-09-27 DIAGNOSIS — M6281 Muscle weakness (generalized): Secondary | ICD-10-CM

## 2022-09-27 DIAGNOSIS — M25611 Stiffness of right shoulder, not elsewhere classified: Secondary | ICD-10-CM

## 2022-09-27 DIAGNOSIS — M25511 Pain in right shoulder: Secondary | ICD-10-CM

## 2022-09-27 NOTE — Therapy (Signed)
OUTPATIENT PHYSICAL THERAPY SHOULDER EVALUATION   Patient Name: Crystal Parks MRN: [id number] DOB:Apr 11, 1975, 47 y.o., female Today's Date: 09/28/2022  END OF SESSION:  PT End of Session - 09/27/22 0935     Visit Number 1    Number of Visits 16    Date for PT Re-Evaluation 11/05/22    Authorization Type U[employer]    PT Start Time 0930    PT Stop Time 1013    PT Time Calculation (min) 43 min    Activity Tolerance Patient tolerated treatment well    Behavior During Therapy WFL for tasks assessed/performed             Past Medical History:  Diagnosis Date   ADHD (attention deficit hyperactivity disorder)    Arthritis    bilateral hands/fingers   Complication of anesthesia    Woke up extremely cold after anesthesia. Required warm blankets.   Headache    Migraine   Hypothyroidism    Thyroid disease    Past Surgical History:  Procedure Laterality Date   ceas     c-section   KNEE ARTHROSCOPY WITH MENISCAL REPAIR Left 06/04/2022   Procedure: LEFT KNEE ARTHROSCOPY WITH MEDIAL MENISCAL REPAIR;  Surgeon: Huel Cote, MD;  Location: [facility] OR;  Service: Orthopedics;  Laterality: Left;   REDUCTION MAMMAPLASTY Bilateral    age 22   WISDOM TOOTH EXTRACTION     Patient Active Problem List   Diagnosis Date Noted   Acute medial meniscus tear of left knee 06/04/2022   Inappropriate sinus tachycardia 06/30/2018   Palpitations 06/30/2018   Acute cystitis with hematuria 11/21/2016   Hypothyroidism 11/29/2015   Impaired fasting glucose 11/29/2015   Myalgia 11/29/2015   Female pattern alopecia 11/15/2011   Telogen effluvium 11/15/2011   ALLERGIC RHINITIS 10/18/2009   ASTHMA 10/18/2009   Asthma 10/18/2009    [facility]   REFERRING PROVIDER: Dr Huel Cote   REFERRING DIAG: RIGHT SHOULDER LABRAL TEAR   THERAPY DIAG:  Right shoulder pain, unspecified chronicity  Stiffness of right shoulder, not elsewhere classified  Muscle weakness  (generalized)  Rationale for Evaluation and Treatment: Rehabilitation  ONSET DATE:   SUBJECTIVE:                                                                                                                                                                                      SUBJECTIVE STATEMENT: Patient has long history of right shoulder instability and pain.  She had a right shoulder labral repair on 09/24/2022.  At this time her pain is well-controlled.  She is currently compliant with sling use.  She did recently have a meniscal repair.  Hand dominance: Right  PERTINENT HISTORY: Left knee meniscal repair   PAIN:  Are you having pain? Yes: NPRS scale: 2/10 Pain location: right anterior shoulder  Pain description: aching  Aggravating factors: rest  Relieving factors:   PRECAUTIONS: Shoulder  RED FLAGS: None   WEIGHT BEARING RESTRICTIONS: Yes NWB  FALLS:  Has patient fallen in last 6 months? 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   PLOF: Independent   Hobbies:  Martial arts  [employer] training  PATIENT GOALS:   NEXT MD VISIT:   OBJECTIVE:   DIAGNOSTIC FINDINGS:  Nothing post-op  PATIENT SURVEYS:  FOTO    COGNITION: Overall cognitive status: Within functional limits for tasks assessed     SENSATION: Some impaired sesation into her thumb   POSTURE: Good   UPPER EXTREMITY ROM:   Passive ROM Right eval Left eval  Shoulder flexion 80 without resistance    Shoulder extension    Shoulder abduction    Shoulder adduction    Shoulder internal rotation To stomach    Shoulder external rotation 4   Elbow flexion    Elbow extension    Wrist flexion    Wrist extension    Wrist ulnar deviation    Wrist radial deviation    Wrist pronation    Wrist supination    (Blank rows = not tested)  UPPER EXTREMITY MMT:  MMT Right eval Left eval  Shoulder flexion    Shoulder extension    Shoulder  abduction    Shoulder adduction    Shoulder internal rotation    Shoulder external rotation    Middle trapezius    Lower trapezius    Elbow flexion    Elbow extension    Wrist flexion    Wrist extension    Wrist ulnar deviation    Wrist radial deviation    Wrist pronation    Wrist supination    Grip strength (lbs)    (Blank rows = not tested)      PALPATION:  No unexpected TTP    TODAY'S TREATMENT:                                                                                                                                         DATE: Access Code: [email or web address] URL: [email or web address] Date: 09/27/2022 Prepared by: Lorayne Bender  Exercises - Tip Pinch with Putty  - 1 x daily - 7 x weekly - 3 sets - 10 reps - Key Pinch with Putty  - 1 x daily - 7 x weekly - 3 sets - 10 reps - Thumb MCP and IP Flexion with Putty  - 1 x daily - 7 x weekly - 3 sets - 10 reps - Circular Shoulder Pendulum with Table Support  - 1 x daily - 7 x weekly - 3 sets - 10 reps - Standing  Single Arm Elbow Flexion with Resistance  - 1 x daily - 7 x weekly - 3 sets - 10 reps  Manual: PROM into all planes per protocol    PATIENT EDUCATION: Education details: HEP, symptom management,  Person educated: Patient Education method: Explanation, Demonstration, Tactile cues, Verbal cues, and Handouts Education comprehension: verbalized understanding, returned demonstration, verbal cues required, tactile cues required, and needs further education  HOME EXERCISE PROGRAM: Access Code: [email or web address] URL: [email or web address] Date: 09/27/2022 Prepared by: Lorayne Bender  Exercises - Tip Pinch with Putty  - 1 x daily - 7 x weekly - 3 sets - 10 reps - Key Pinch with Putty  - 1 x daily - 7 x weekly - 3 sets - 10 reps - Thumb MCP and IP Flexion with Putty  - 1 x daily - 7 x weekly - 3 sets - 10 reps - Circular Shoulder Pendulum with Table Support  - 1 x daily - 7 x weekly - 3 sets - 10  reps - Standing Single Arm Elbow Flexion with Resistance  - 1 x daily - 7 x weekly - 3 sets - 10 reps  ASSESSMENT:  CLINICAL IMPRESSION: Patient is a 47 year old female status post right labral repair on 09/24/2022.  At this time her pain is well-controlled.  She presents with expected limitations in range of motion, strength, and functional mobility.  She is very active in martial arts prior to her surgery.  She would like to return to those activities.  She would also like to begin resistance training.  She would benefit from skilled therapy to improve functional mobility of dominant arm. OBJECTIVE IMPAIRMENTS: decreased knowledge of condition, decreased ROM, decreased strength, increased muscle spasms, impaired UE functional use, and pain.   ACTIVITY LIMITATIONS: carrying, lifting, sleeping, bathing, dressing, self feeding, and reach over head  PARTICIPATION LIMITATIONS: meal prep, cleaning, laundry, driving, shopping, community activity, occupation, and yard work  PERSONAL FACTORS: 1-2 comorbidities: recent knee surgery   are also affecting patient's functional outcome.   REHAB POTENTIAL: Excellent  CLINICAL DECISION MAKING: Stable/uncomplicated  EVALUATION COMPLEXITY: Low   GOALS: Goals reviewed with patient? Yes  SHORT TERM GOALS: Target date: 11/08/2022    Patient will increase passive right shoulder flexion to 90 degrees Baseline: Goal status: INITIAL  2.  Patient will increase passive shoulder external rotation to 30 degrees right Baseline:  Goal status: INITIAL  3.  Patient will wean out of sling when directed by MD Baseline:  Goal status: INITIAL  4.  Patient will be independent with base exercise program Baseline:  Goal status: INITIAL  LONG TERM GOALS: Target date: 12/21/2022      Patient will reach overhead with 2 pound weight without increased pain in order to perform ADLs Baseline:  Goal status: INITIAL  2.  Patient will reach behind her back  without pain in order to perform ADLs Baseline:  Goal status: INITIAL  3.  Patient will reach behind her head without pain in order to perform ADLs Baseline:  Goal status: INITIAL  4.  Patient will return to light gym program when cleared by MD Baseline:  Goal status: INITIAL PLAN:  PT FREQUENCY: 2x/week  PT DURATION: 12 weeks  PLANNED INTERVENTIONS:  Therapeutic exercises, Therapeutic activity, Neuromuscular re-education, Balance training, Gait training, Patient/Family education, Self Care, Joint mobilization, Stair training, DME instructions, Aquatic Therapy, Dry Needling, Electrical stimulation, Cryotherapy, Moist heat, Taping, Manual therapy, and Re-evaluation.   PLAN FOR NEXT SESSION: begin per shoulder instability protocol    Onalee Hua  Marquita Palms, PT 09/28/2022, 12:28 PM

## 2022-09-28 ENCOUNTER — Other Ambulatory Visit (HOSPITAL_BASED_OUTPATIENT_CLINIC_OR_DEPARTMENT_OTHER): Payer: Self-pay

## 2022-09-28 ENCOUNTER — Encounter (HOSPITAL_BASED_OUTPATIENT_CLINIC_OR_DEPARTMENT_OTHER): Payer: Self-pay | Admitting: Physical Therapy

## 2022-10-04 ENCOUNTER — Telehealth (HOSPITAL_BASED_OUTPATIENT_CLINIC_OR_DEPARTMENT_OTHER): Payer: Self-pay | Admitting: Orthopaedic Surgery

## 2022-10-04 ENCOUNTER — Ambulatory Visit (HOSPITAL_BASED_OUTPATIENT_CLINIC_OR_DEPARTMENT_OTHER): Payer: 59 | Attending: Orthopaedic Surgery | Admitting: Physical Therapy

## 2022-10-04 ENCOUNTER — Encounter (HOSPITAL_BASED_OUTPATIENT_CLINIC_OR_DEPARTMENT_OTHER): Payer: Self-pay | Admitting: Physical Therapy

## 2022-10-04 DIAGNOSIS — M25611 Stiffness of right shoulder, not elsewhere classified: Secondary | ICD-10-CM | POA: Diagnosis present

## 2022-10-04 DIAGNOSIS — M6281 Muscle weakness (generalized): Secondary | ICD-10-CM | POA: Insufficient documentation

## 2022-10-04 DIAGNOSIS — M25511 Pain in right shoulder: Secondary | ICD-10-CM | POA: Diagnosis present

## 2022-10-04 NOTE — Telephone Encounter (Signed)
Post op

## 2022-10-04 NOTE — Therapy (Signed)
OUTPATIENT PHYSICAL THERAPY SHOULDER EVALUATION   Patient Name: Crystal Parks MRN: [id number] DOB:1975/12/23, 47 y.o., female Today's Date: 10/04/2022  END OF SESSION:  PT End of Session - 10/04/22 1434     Visit Number 2    Number of Visits 16    Date for PT Re-Evaluation 12/21/22    Authorization Type UHC    PT Start Time 1433    PT Stop Time 1512    PT Time Calculation (min) 39 min    Activity Tolerance Patient tolerated treatment well    Behavior During Therapy WFL for tasks assessed/performed             Past Medical History:  Diagnosis Date   ADHD (attention deficit hyperactivity disorder)    Arthritis    bilateral hands/fingers   Complication of anesthesia    Woke up extremely cold after anesthesia. Required warm blankets.   Headache    Migraine   Hypothyroidism    Thyroid disease    Past Surgical History:  Procedure Laterality Date   ceas     c-section   KNEE ARTHROSCOPY WITH MENISCAL REPAIR Left 06/04/2022   Procedure: LEFT KNEE ARTHROSCOPY WITH MEDIAL MENISCAL REPAIR;  Surgeon: Huel Cote, MD;  Location: [facility] OR;  Service: Orthopedics;  Laterality: Left;   REDUCTION MAMMAPLASTY Bilateral    age 48   WISDOM TOOTH EXTRACTION     Patient Active Problem List   Diagnosis Date Noted   Acute medial meniscus tear of left knee 06/04/2022   Inappropriate sinus tachycardia 06/30/2018   Palpitations 06/30/2018   Acute cystitis with hematuria 11/21/2016   Hypothyroidism 11/29/2015   Impaired fasting glucose 11/29/2015   Myalgia 11/29/2015   Female pattern alopecia 11/15/2011   Telogen effluvium 11/15/2011   ALLERGIC RHINITIS 10/18/2009   ASTHMA 10/18/2009   Asthma 10/18/2009    [facility]   REFERRING PROVIDER: Dr Huel Cote   REFERRING DIAG: RIGHT SHOULDER LABRAL TEAR   THERAPY DIAG:  No diagnosis found.  Rationale for Evaluation and Treatment: Rehabilitation  ONSET DATE:   SUBJECTIVE:                                                                                                                                                                                       SUBJECTIVE STATEMENT: The patient has been doing well. She has been complaint with sling use.   Eval:Patient has long history of right shoulder instability and pain.  She had a right shoulder labral repair on 09/24/2022.  At this time her pain is well-controlled.  She is currently compliant with sling use.  She did recently have a meniscal repair.  Hand dominance: Right  PERTINENT HISTORY: Left knee meniscal repair   PAIN:  Are you having pain? Yes: NPRS scale: 2/10 Pain location: right anterior shoulder  Pain description: aching  Aggravating factors: rest  Relieving factors:   PRECAUTIONS: Shoulder  RED FLAGS: None   WEIGHT BEARING RESTRICTIONS: Yes NWB  FALLS:  Has patient fallen in last 6 months? 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   PLOF: Independent   Hobbies:  Martial arts  [employer] training  PATIENT GOALS:   NEXT MD VISIT:   OBJECTIVE:   DIAGNOSTIC FINDINGS:  Nothing post-op  PATIENT SURVEYS:  FOTO    COGNITION: Overall cognitive status: Within functional limits for tasks assessed     SENSATION: Some impaired sesation into her thumb   POSTURE: Good   UPPER EXTREMITY ROM:   Passive ROM Right eval Left eval  Shoulder flexion 80 without resistance    Shoulder extension    Shoulder abduction    Shoulder adduction    Shoulder internal rotation To stomach    Shoulder external rotation 4   Elbow flexion    Elbow extension    Wrist flexion    Wrist extension    Wrist ulnar deviation    Wrist radial deviation    Wrist pronation    Wrist supination    (Blank rows = not tested)  UPPER EXTREMITY MMT:  MMT Right eval Left eval  Shoulder flexion    Shoulder extension    Shoulder abduction    Shoulder adduction    Shoulder internal  rotation    Shoulder external rotation    Middle trapezius    Lower trapezius    Elbow flexion    Elbow extension    Wrist flexion    Wrist extension    Wrist ulnar deviation    Wrist radial deviation    Wrist pronation    Wrist supination    Grip strength (lbs)    (Blank rows = not tested)      PALPATION:  No unexpected TTP    TODAY'S TREATMENT:                                                                                                                                         DATE:  9/5 Manual: PROM into protocol limits   Self flexion x10 in sitting  Self ER x10 in sitting supported   [place] 2 min with min cuing.   Scap retraction 2x20  Sling isometrics 10x 5 sec hold ER/IR Flexion      Eval:Access Code: [email or web address] URL: [email or web address] Date: 09/27/2022 Prepared by: Lorayne Bender  Exercises - Tip Pinch with Putty  - 1 x daily - 7 x weekly - 3 sets - 10 reps - Key Pinch with Putty  - 1 x daily - 7 x weekly - 3 sets - 10  reps - Thumb MCP and IP Flexion with Putty  - 1 x daily - 7 x weekly - 3 sets - 10 reps - Circular Shoulder Pendulum with Table Support  - 1 x daily - 7 x weekly - 3 sets - 10 reps - Standing Single Arm Elbow Flexion with Resistance  - 1 x daily - 7 x weekly - 3 sets - 10 reps  Manual: PROM into all planes per protocol    PATIENT EDUCATION: Education details: HEP, symptom management,  Person educated: Patient Education method: Explanation, Demonstration, Tactile cues, Verbal cues, and Handouts Education comprehension: verbalized understanding, returned demonstration, verbal cues required, tactile cues required, and needs further education  HOME EXERCISE PROGRAM: Access Code: [email or web address] URL: [email or web address] Date: 09/27/2022 Prepared by: Lorayne Bender  Exercises - Tip Pinch with Putty  - 1 x daily - 7 x weekly - 3 sets - 10 reps - Key Pinch with Putty  - 1 x daily - 7 x weekly - 3 sets - 10 reps -  Thumb MCP and IP Flexion with Putty  - 1 x daily - 7 x weekly - 3 sets - 10 reps - Circular Shoulder Pendulum with Table Support  - 1 x daily - 7 x weekly - 3 sets - 10 reps - Standing Single Arm Elbow Flexion with Resistance  - 1 x daily - 7 x weekly - 3 sets - 10 reps  ASSESSMENT:  CLINICAL IMPRESSION:  The patient is doing well. Her range is past protocol limits without pain or resistance. We gave her isometrics and assisted motion today. She had no significant pain. She was havign a reaction to the tegaderm. She felt better when it was removed. Therapy contacted M assistant who advised baindaids or nothing. She is allergic to Band-Aid. We gae her an updated HEP  Eval: Patient is a 47 year old female status post right labral repair on 09/24/2022.  At this time her pain is well-controlled.  She presents with expected limitations in range of motion, strength, and functional mobility.  She is very active in martial arts prior to her surgery.  She would like to return to those activities.  She would also like to begin resistance training.  She would benefit from skilled therapy to improve functional mobility of dominant arm. OBJECTIVE IMPAIRMENTS: decreased knowledge of condition, decreased ROM, decreased strength, increased muscle spasms, impaired UE functional use, and pain.   ACTIVITY LIMITATIONS: carrying, lifting, sleeping, bathing, dressing, self feeding, and reach over head  PARTICIPATION LIMITATIONS: meal prep, cleaning, laundry, driving, shopping, community activity, occupation, and yard work  PERSONAL FACTORS: 1-2 comorbidities: recent knee surgery   are also affecting patient's functional outcome.   REHAB POTENTIAL: Excellent  CLINICAL DECISION MAKING: Stable/uncomplicated  EVALUATION COMPLEXITY: Low   GOALS: Goals reviewed with patient? Yes  SHORT TERM GOALS: Target date: 11/08/2022    Patient will increase passive right shoulder flexion to 90 degrees Baseline: Goal  status: INITIAL  2.  Patient will increase passive shoulder external rotation to 30 degrees right Baseline:  Goal status: INITIAL  3.  Patient will wean out of sling when directed by MD Baseline:  Goal status: INITIAL  4.  Patient will be independent with base exercise program Baseline:  Goal status: INITIAL  LONG TERM GOALS: Target date: 12/21/2022      Patient will reach overhead with 2 pound weight without increased pain in order to perform ADLs Baseline:  Goal status: INITIAL  2.  Patient will reach behind her back without  pain in order to perform ADLs Baseline:  Goal status: INITIAL  3.  Patient will reach behind her head without pain in order to perform ADLs Baseline:  Goal status: INITIAL  4.  Patient will return to light gym program when cleared by MD Baseline:  Goal status: INITIAL PLAN:  PT FREQUENCY: 2x/week  PT DURATION: 12 weeks  PLANNED INTERVENTIONS:  Therapeutic exercises, Therapeutic activity, Neuromuscular re-education, Balance training, Gait training, Patient/Family education, Self Care, Joint mobilization, Stair training, DME instructions, Aquatic Therapy, Dry Needling, Electrical stimulation, Cryotherapy, Moist heat, Taping, Manual therapy, and Re-evaluation.   PLAN FOR NEXT SESSION: begin per shoulder instability protocol    Dessie Coma, PT 10/04/2022, 2:59 PM

## 2022-10-06 ENCOUNTER — Encounter (HOSPITAL_BASED_OUTPATIENT_CLINIC_OR_DEPARTMENT_OTHER): Payer: Self-pay | Admitting: Orthopaedic Surgery

## 2022-10-08 ENCOUNTER — Encounter (HOSPITAL_BASED_OUTPATIENT_CLINIC_OR_DEPARTMENT_OTHER): Payer: Self-pay | Admitting: Physical Therapy

## 2022-10-10 ENCOUNTER — Other Ambulatory Visit (HOSPITAL_BASED_OUTPATIENT_CLINIC_OR_DEPARTMENT_OTHER): Payer: Self-pay

## 2022-10-10 ENCOUNTER — Ambulatory Visit (INDEPENDENT_AMBULATORY_CARE_PROVIDER_SITE_OTHER): Payer: 59 | Admitting: Orthopaedic Surgery

## 2022-10-10 ENCOUNTER — Ambulatory Visit (HOSPITAL_BASED_OUTPATIENT_CLINIC_OR_DEPARTMENT_OTHER): Payer: 59 | Admitting: Physical Therapy

## 2022-10-10 ENCOUNTER — Encounter (HOSPITAL_BASED_OUTPATIENT_CLINIC_OR_DEPARTMENT_OTHER): Payer: Self-pay | Admitting: Physical Therapy

## 2022-10-10 DIAGNOSIS — M25511 Pain in right shoulder: Secondary | ICD-10-CM | POA: Diagnosis not present

## 2022-10-10 DIAGNOSIS — M25611 Stiffness of right shoulder, not elsewhere classified: Secondary | ICD-10-CM

## 2022-10-10 DIAGNOSIS — M6281 Muscle weakness (generalized): Secondary | ICD-10-CM

## 2022-10-10 DIAGNOSIS — S43431A Superior glenoid labrum lesion of right shoulder, initial encounter: Secondary | ICD-10-CM

## 2022-10-10 MED ORDER — MELOXICAM 15 MG PO TABS
15.0000 mg | ORAL_TABLET | Freq: Every day | ORAL | 2 refills | Status: AC
  Filled 2022-10-10: qty 30, 30d supply, fill #0

## 2022-10-10 MED ORDER — METHOCARBAMOL 500 MG PO TABS
500.0000 mg | ORAL_TABLET | Freq: Four times a day (QID) | ORAL | 1 refills | Status: DC
  Filled 2022-10-10: qty 30, 8d supply, fill #0
  Filled 2022-10-29: qty 30, 8d supply, fill #1

## 2022-10-10 NOTE — Progress Notes (Signed)
Post Operative Evaluation    Procedure/Date of Surgery: Status post right shoulder labral repair 8/26  Interval History:    Presents today 2 weeks status post above procedure.  Overall she is doing extremely well.  She has been having some soreness after region to move her But this is overall worn off.  She is been working well with physical therapy.  She has been compliant with all restrictions.   PMH/PSH/Family History/Social History/Meds/Allergies:    Past Medical History:  Diagnosis Date   ADHD (attention deficit hyperactivity disorder)    Arthritis    bilateral hands/fingers   Complication of anesthesia    Woke up extremely cold after anesthesia. Required warm blankets.   Headache    Migraine   Hypothyroidism    Thyroid disease    Past Surgical History:  Procedure Laterality Date   ceas     c-section   KNEE ARTHROSCOPY WITH MENISCAL REPAIR Left 06/04/2022   Procedure: LEFT KNEE ARTHROSCOPY WITH MEDIAL MENISCAL REPAIR;  Surgeon: Huel Cote, MD;  Location: [facility] OR;  Service: Orthopedics;  Laterality: Left;   REDUCTION MAMMAPLASTY Bilateral    age 47   WISDOM TOOTH EXTRACTION     Social History   Socioeconomic History   Marital status: Married    Spouse name: Not on file   Number of children: Not on file   Years of education: Not on file   Highest education level: Not on file  Occupational History   Not on file  Tobacco Use   Smoking status: Never   Smokeless tobacco: Never  Substance and Sexual Activity   Alcohol use: Yes    Comment: occasionally   Drug use: No   Sexual activity: Yes  Other Topics Concern   Not on file  Social History Narrative   Not on file   Social Determinants of Health   Financial Resource Strain: Not on file  Food Insecurity: Not on file  Transportation Needs: Not on file  Physical Activity: Not on file  Stress: Not on file  Social Connections: Not on file   No family history on  file. Allergies  Allergen Reactions   Food Other (See Comments)    Eggplant-hives/itching/oral swelling   Ciprofloxacin Itching, Nausea And Vomiting and Rash    Hallucinations/fever   Current Outpatient Medications  Medication Sig Dispense Refill   meloxicam (MOBIC) 15 MG tablet Take 1 tablet (15 mg total) by mouth daily. 30 tablet 2   methocarbamol (ROBAXIN) 500 MG tablet Take 1 tablet (500 mg total) by mouth 4 (four) times daily. 30 tablet 1   ALPRAZolam (XANAX) 0.5 MG tablet Take 0.5 mg by mouth daily as needed for anxiety.     aspirin EC 325 MG tablet Take 1 tablet (325 mg total) by mouth daily. 14 tablet 0   COLLAGEN PO Take 1 tablet by mouth daily in the afternoon. (Gummy)     levocetirizine (XYZAL) 5 MG tablet Take 5 mg by mouth daily in the afternoon.     MAGNESIUM PO Take 1 tablet by mouth in the morning and at bedtime. (Gummy)     MOUNJARO 2.5 MG/0.5ML Pen Inject 2.5 mg into the skin every Monday.     NP THYROID 15 MG tablet Take 15 mg by mouth See admin instructions. 15 mg + 60 mg=75 mg (  take 75 mg dose by mouth every other day--alternating with 90 mg dosage)     NP THYROID 90 MG tablet Take 90 mg by mouth every other day. (Take 90 mg dose by mouth every other day--alternating with 75 mg dosage)     oxyCODONE (ROXICODONE) 5 MG immediate release tablet Take 1 tablet (5 mg total) by mouth every 4 (four) hours as needed for severe pain or breakthrough pain. 5 tablet 0   oxyCODONE (ROXICODONE) 5 MG immediate release tablet Take 1 tablet (5 mg total) by mouth every 4 (four) hours as needed for severe pain. 5 tablet 0   thyroid (ARMOUR) 60 MG tablet Take 60 mg by mouth See admin instructions. 60 mg + 15 mg=75 mg (take 75 mg dose by mouth every other day--alternating with 90 mg dosage)     Triamcinolone Acetonide (NASACORT AQ NA) Place 1 spray into the nose every evening.     No current facility-administered medications for this visit.   No results found.  Review of Systems:   A ROS  was performed including pertinent positives and negatives as documented in the HPI.   Musculoskeletal Exam:    There were no vitals taken for this visit.  Right shoulder incisions are well-appearing without erythema or drainage.  He is transition she can forward elevate to 90 degrees of external rotation at side to 30 degrees internal rotation deferred today intact distal neurosensory exam  Imaging:      I personally reviewed and interpreted the radiographs.   Assessment:   2 weeks status post right shoulder labral pair overall doing extremely well.  At this time she will continue to advance according to her they will repair protocol.  I do believe she would be a candidate for early active range of motion which may begin at 4 weeks and she may discontinue her sling at that point.  Plan :    -Return to clinic in 4 weeks for reassessment      I personally saw and evaluated the patient, and participated in the management and treatment plan.  Huel Cote, MD Attending Physician, Orthopedic Surgery  This document was dictated using Dragon voice recognition software. A reasonable attempt at proof reading has been made to minimize errors.

## 2022-10-10 NOTE — Therapy (Signed)
OUTPATIENT PHYSICAL THERAPY SHOULDER EVALUATION   Patient Name: Crystal Parks MRN: [id number] DOB:[birth year], 47 y.o., female Today's Date: 10/10/2022  END OF SESSION:  PT End of Session - 10/10/22 1008     Visit Number 3    Number of Visits 16    Date for PT Re-Evaluation 12/21/22    Authorization Type [employer]    PT Start Time 0930    PT Stop Time 1012    PT Time Calculation (min) 42 min    Activity Tolerance Patient tolerated treatment well    Behavior During Therapy WFL for tasks assessed/performed             Past Medical History:  Diagnosis Date   ADHD (attention deficit hyperactivity disorder)    Arthritis    bilateral hands/fingers   Complication of anesthesia    Woke up extremely cold after anesthesia. Required warm blankets.   Headache    Migraine   Hypothyroidism    Thyroid disease    Past Surgical History:  Procedure Laterality Date   ceas     c-section   KNEE ARTHROSCOPY WITH MENISCAL REPAIR Left 06/04/2022   Procedure: LEFT KNEE ARTHROSCOPY WITH MEDIAL MENISCAL REPAIR;  Surgeon: Huel Cote, MD;  Location: [facility] OR;  Service: Orthopedics;  Laterality: Left;   REDUCTION MAMMAPLASTY Bilateral    age 52   WISDOM TOOTH EXTRACTION     Patient Active Problem List   Diagnosis Date Noted   Acute medial meniscus tear of left knee 06/04/2022   Inappropriate sinus tachycardia 06/30/2018   Palpitations 06/30/2018   Acute cystitis with hematuria 11/21/2016   Hypothyroidism 11/29/2015   Impaired fasting glucose 11/29/2015   Myalgia 11/29/2015   Female pattern alopecia 11/15/2011   Telogen effluvium 11/15/2011   ALLERGIC RHINITIS 10/18/2009   ASTHMA 10/18/2009   Asthma 10/18/2009    [facility]   REFERRING PROVIDER: Dr Huel Cote   REFERRING DIAG: RIGHT SHOULDER LABRAL TEAR   THERAPY DIAG:  Right shoulder pain, unspecified chronicity  Stiffness of right shoulder, not elsewhere classified  Muscle weakness  (generalized)  Rationale for Evaluation and Treatment: Rehabilitation  ONSET DATE:   SUBJECTIVE:                                                                                                                                                                                      SUBJECTIVE STATEMENT: The patient has been a little sore. She otherwise has been doing well. She reports this is more achy than the knee. She was assured that this is typical.   Eval:Patient has long history of right shoulder instability and pain.  She had a  right shoulder labral repair on 09/24/2022.  At this time her pain is well-controlled.  She is currently compliant with sling use.  She did recently have a meniscal repair.  Hand dominance: Right  PERTINENT HISTORY: Left knee meniscal repair   PAIN:  Are you having pain? Yes: NPRS scale: 2/10 Pain location: right anterior shoulder  Pain description: aching  Aggravating factors: rest  Relieving factors:   PRECAUTIONS: Shoulder  RED FLAGS: None   WEIGHT BEARING RESTRICTIONS: Yes NWB  FALLS:  Has patient fallen in last 6 months? 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   PLOF: Independent   Hobbies:  Martial arts  [employer] training  PATIENT GOALS:   NEXT MD VISIT:   OBJECTIVE:   DIAGNOSTIC FINDINGS:  Nothing post-op  PATIENT SURVEYS:  FOTO    COGNITION: Overall cognitive status: Within functional limits for tasks assessed     SENSATION: Some impaired sesation into her thumb   POSTURE: Good   UPPER EXTREMITY ROM:   Passive ROM Right eval Left eval  Shoulder flexion 80 without resistance    Shoulder extension    Shoulder abduction    Shoulder adduction    Shoulder internal rotation To stomach    Shoulder external rotation 4   Elbow flexion    Elbow extension    Wrist flexion    Wrist extension    Wrist ulnar deviation    Wrist radial deviation    Wrist  pronation    Wrist supination    (Blank rows = not tested)  UPPER EXTREMITY MMT:  MMT Right eval Left eval  Shoulder flexion    Shoulder extension    Shoulder abduction    Shoulder adduction    Shoulder internal rotation    Shoulder external rotation    Middle trapezius    Lower trapezius    Elbow flexion    Elbow extension    Wrist flexion    Wrist extension    Wrist ulnar deviation    Wrist radial deviation    Wrist pronation    Wrist supination    Grip strength (lbs)    (Blank rows = not tested)      PALPATION:  No unexpected TTP    TODAY'S TREATMENT:                                                                                                                                         DATE:  9/11 Manual: PROM into protocol limits  Trigger point release to upper trap and posterior shoulder   Wand press 2x15  Wand flex in pain free range 2x15   ER iso  x15   9/5 Manual: PROM into protocol limits   Self flexion x10 in sitting  Self ER x10 in sitting supported   Pulley 2 min with min cuing.   Scap  retraction 2x20  Sling isometrics 10x 5 sec hold ER/IR Flexion      Eval:Access Code: [email or web address] URL: [email or web address] Date: 09/27/2022 Prepared by: Lorayne Bender  Exercises - Tip Pinch with Putty  - 1 x daily - 7 x weekly - 3 sets - 10 reps - Key Pinch with Putty  - 1 x daily - 7 x weekly - 3 sets - 10 reps - Thumb MCP and IP Flexion with Putty  - 1 x daily - 7 x weekly - 3 sets - 10 reps - Circular Shoulder Pendulum with Table Support  - 1 x daily - 7 x weekly - 3 sets - 10 reps - Standing Single Arm Elbow Flexion with Resistance  - 1 x daily - 7 x weekly - 3 sets - 10 reps  Manual: PROM into all planes per protocol    PATIENT EDUCATION: Education details: HEP, symptom management,  Person educated: Patient Education method: Explanation, Demonstration, Tactile cues, Verbal cues, and Handouts Education comprehension: verbalized  understanding, returned demonstration, verbal cues required, tactile cues required, and needs further education  HOME EXERCISE PROGRAM: Access Code: [email or web address] URL: [email or web address] Date: 09/27/2022 Prepared by: Lorayne Bender  Exercises - Tip Pinch with Putty  - 1 x daily - 7 x weekly - 3 sets - 10 reps - Key Pinch with Putty  - 1 x daily - 7 x weekly - 3 sets - 10 reps - Thumb MCP and IP Flexion with Putty  - 1 x daily - 7 x weekly - 3 sets - 10 reps - Circular Shoulder Pendulum with Table Support  - 1 x daily - 7 x weekly - 3 sets - 10 reps - Standing Single Arm Elbow Flexion with Resistance  - 1 x daily - 7 x weekly - 3 sets - 10 reps  ASSESSMENT:  CLINICAL IMPRESSION: The patient continues to progress well. She has a persistent ached but it is low level and was no worse with exercises. She will see the MD today. Her flexion range is progressing very well. Seh has a little pain with ER but she is at the protocol limit of movement at this time. We will continue to progress per MD recommendation.    Eval: Patient is a 47 year old female status post right labral repair on 09/24/2022.  At this time her pain is well-controlled.  She presents with expected limitations in range of motion, strength, and functional mobility.  She is very active in martial arts prior to her surgery.  She would like to return to those activities.  She would also like to begin resistance training.  She would benefit from skilled therapy to improve functional mobility of dominant arm. OBJECTIVE IMPAIRMENTS: decreased knowledge of condition, decreased ROM, decreased strength, increased muscle spasms, impaired UE functional use, and pain.   ACTIVITY LIMITATIONS: carrying, lifting, sleeping, bathing, dressing, self feeding, and reach over head  PARTICIPATION LIMITATIONS: meal prep, cleaning, laundry, driving, shopping, community activity, occupation, and yard work  PERSONAL FACTORS: 1-2 comorbidities:  recent knee surgery   are also affecting patient's functional outcome.   REHAB POTENTIAL: Excellent  CLINICAL DECISION MAKING: Stable/uncomplicated  EVALUATION COMPLEXITY: Low   GOALS: Goals reviewed with patient? Yes  SHORT TERM GOALS: Target date: 11/08/2022    Patient will increase passive right shoulder flexion to 90 degrees Baseline: Goal status: INITIAL  2.  Patient will increase passive shoulder external rotation to 30 degrees right Baseline:  Goal status: INITIAL  3.  Patient will wean out of sling  when directed by MD Baseline:  Goal status: INITIAL  4.  Patient will be independent with base exercise program Baseline:  Goal status: INITIAL  LONG TERM GOALS: Target date: 12/21/2022      Patient will reach overhead with 2 pound weight without increased pain in order to perform ADLs Baseline:  Goal status: INITIAL  2.  Patient will reach behind her back without pain in order to perform ADLs Baseline:  Goal status: INITIAL  3.  Patient will reach behind her head without pain in order to perform ADLs Baseline:  Goal status: INITIAL  4.  Patient will return to light gym program when cleared by MD Baseline:  Goal status: INITIAL PLAN:  PT FREQUENCY: 2x/week  PT DURATION: 12 weeks  PLANNED INTERVENTIONS:  Therapeutic exercises, Therapeutic activity, Neuromuscular re-education, Balance training, Gait training, Patient/Family education, Self Care, Joint mobilization, Stair training, DME instructions, Aquatic Therapy, Dry Needling, Electrical stimulation, Cryotherapy, Moist heat, Taping, Manual therapy, and Re-evaluation.   PLAN FOR NEXT SESSION: begin per shoulder instability protocol    Dessie Coma, PT 10/10/2022, 11:21 AM

## 2022-10-11 ENCOUNTER — Ambulatory Visit (HOSPITAL_BASED_OUTPATIENT_CLINIC_OR_DEPARTMENT_OTHER): Payer: 59 | Admitting: Physical Therapy

## 2022-10-12 ENCOUNTER — Encounter (HOSPITAL_BASED_OUTPATIENT_CLINIC_OR_DEPARTMENT_OTHER): Payer: Self-pay

## 2022-10-13 ENCOUNTER — Other Ambulatory Visit (HOSPITAL_BASED_OUTPATIENT_CLINIC_OR_DEPARTMENT_OTHER): Payer: Self-pay

## 2022-10-15 ENCOUNTER — Encounter (HOSPITAL_BASED_OUTPATIENT_CLINIC_OR_DEPARTMENT_OTHER): Payer: Self-pay | Admitting: Physical Therapy

## 2022-10-15 ENCOUNTER — Ambulatory Visit (HOSPITAL_BASED_OUTPATIENT_CLINIC_OR_DEPARTMENT_OTHER): Payer: 59 | Admitting: Physical Therapy

## 2022-10-15 DIAGNOSIS — M6281 Muscle weakness (generalized): Secondary | ICD-10-CM

## 2022-10-15 DIAGNOSIS — M25511 Pain in right shoulder: Secondary | ICD-10-CM

## 2022-10-15 DIAGNOSIS — M25611 Stiffness of right shoulder, not elsewhere classified: Secondary | ICD-10-CM

## 2022-10-15 NOTE — Therapy (Signed)
OUTPATIENT PHYSICAL THERAPY SHOULDER EVALUATION   Patient Name: Crystal Parks MRN: [id number] DOB:1975-03-25, 47 y.o., female Today's Date: 10/15/2022  END OF SESSION:  PT End of Session - 10/15/22 1043     Visit Number 4    Number of Visits 16    Date for PT Re-Evaluation 12/21/22    Authorization Type UHC    PT Start Time 0845    PT Stop Time 0928    PT Time Calculation (min) 43 min    Activity Tolerance Patient tolerated treatment well    Behavior During Therapy WFL for tasks assessed/performed              Past Medical History:  Diagnosis Date   ADHD (attention deficit hyperactivity disorder)    Arthritis    bilateral hands/fingers   Complication of anesthesia    Woke up extremely cold after anesthesia. Required warm blankets.   Headache    Migraine   Hypothyroidism    Thyroid disease    Past Surgical History:  Procedure Laterality Date   ceas     c-section   KNEE ARTHROSCOPY WITH MENISCAL REPAIR Left 06/04/2022   Procedure: LEFT KNEE ARTHROSCOPY WITH MEDIAL MENISCAL REPAIR;  Surgeon: Huel Cote, MD;  Location: [facility] OR;  Service: Orthopedics;  Laterality: Left;   REDUCTION MAMMAPLASTY Bilateral    age 45   WISDOM TOOTH EXTRACTION     Patient Active Problem List   Diagnosis Date Noted   Acute medial meniscus tear of left knee 06/04/2022   Inappropriate sinus tachycardia 06/30/2018   Palpitations 06/30/2018   Acute cystitis with hematuria 11/21/2016   Hypothyroidism 11/29/2015   Impaired fasting glucose 11/29/2015   Myalgia 11/29/2015   Female pattern alopecia 11/15/2011   Telogen effluvium 11/15/2011   ALLERGIC RHINITIS 10/18/2009   ASTHMA 10/18/2009   Asthma 10/18/2009    [facility]   REFERRING PROVIDER: Dr Huel Cote   REFERRING DIAG: RIGHT SHOULDER LABRAL TEAR   THERAPY DIAG:  Right shoulder pain, unspecified chronicity  Stiffness of right shoulder, not elsewhere classified  Muscle weakness  (generalized)  Rationale for Evaluation and Treatment: Rehabilitation  ONSET DATE:   SUBJECTIVE:                                                                                                                                                                                      SUBJECTIVE STATEMENT: The patient reports she has had some aches and pains. She has seen the MD.  He is happy with her progress. He has advised her she can start to wean out of the sling around 4 weeks. She feels like less pain with  the sling off.    Eval:Patient has long history of right shoulder instability and pain.  She had a right shoulder labral repair on 09/24/2022.  At this time her pain is well-controlled.  She is currently compliant with sling use.  She did recently have a meniscal repair.  Hand dominance: Right  PERTINENT HISTORY: Left knee meniscal repair   PAIN:  Are you having pain? Yes: NPRS scale: 2/10 Pain location: right anterior shoulder  Pain description: aching  Aggravating factors: rest  Relieving factors:   PRECAUTIONS: Shoulder  RED FLAGS: None   WEIGHT BEARING RESTRICTIONS: Yes NWB  FALLS:  Has patient fallen in last 6 months? 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   PLOF: Independent   Hobbies:  Martial arts  [employer] training  PATIENT GOALS:   NEXT MD VISIT:   OBJECTIVE:   DIAGNOSTIC FINDINGS:  Nothing post-op  PATIENT SURVEYS:  FOTO    COGNITION: Overall cognitive status: Within functional limits for tasks assessed     SENSATION: Some impaired sesation into her thumb   POSTURE: Good   UPPER EXTREMITY ROM:   Passive ROM Right eval Left eval  Shoulder flexion 80 without resistance    Shoulder extension    Shoulder abduction    Shoulder adduction    Shoulder internal rotation To stomach    Shoulder external rotation 4   Elbow flexion    Elbow extension    Wrist flexion    Wrist  extension    Wrist ulnar deviation    Wrist radial deviation    Wrist pronation    Wrist supination    (Blank rows = not tested)  UPPER EXTREMITY MMT:  MMT Right eval Left eval  Shoulder flexion    Shoulder extension    Shoulder abduction    Shoulder adduction    Shoulder internal rotation    Shoulder external rotation    Middle trapezius    Lower trapezius    Elbow flexion    Elbow extension    Wrist flexion    Wrist extension    Wrist ulnar deviation    Wrist radial deviation    Wrist pronation    Wrist supination    Grip strength (lbs)    (Blank rows = not tested)      PALPATION:  No unexpected TTP    TODAY'S TREATMENT:                                                                                                                                         DATE:  9/16  Manual: PROM into protocol limits  Trigger point release to upper trap and posterior shoulder; Grade II and III PA and AP mobilization   Wand flex in pain free range 2x15 spent time reviewing pain free ranges and how to strengthen.  Prone row 2x12  Prone extension 2x10    Supine ER 3x10    Given for HEP   9/11 Manual: PROM into protocol limits  Trigger point release to upper trap and posterior shoulder   Wand press 2x15  Wand flex in pain free range 2x15   ER iso  x15   9/5 Manual: PROM into protocol limits   Self flexion x10 in sitting  Self ER x10 in sitting supported   Pulley 2 min with min cuing.   Scap retraction 2x20  Sling isometrics 10x 5 sec hold ER/IR Flexion      Eval:Access Code: [email or web address] URL: [email or web address] Date: 09/27/2022 Prepared by: Lorayne Bender  Exercises - Tip Pinch with Putty  - 1 x daily - 7 x weekly - 3 sets - 10 reps - Key Pinch with Putty  - 1 x daily - 7 x weekly - 3 sets - 10 reps - Thumb MCP and IP Flexion with Putty  - 1 x daily - 7 x weekly - 3 sets - 10 reps - Circular Shoulder Pendulum with Table Support  - 1 x  daily - 7 x weekly - 3 sets - 10 reps - Standing Single Arm Elbow Flexion with Resistance  - 1 x daily - 7 x weekly - 3 sets - 10 reps  Manual: PROM into all planes per protocol    PATIENT EDUCATION: Education details: HEP, symptom management,  Person educated: Patient Education method: Explanation, Demonstration, Tactile cues, Verbal cues, and Handouts Education comprehension: verbalized understanding, returned demonstration, verbal cues required, tactile cues required, and needs further education  HOME EXERCISE PROGRAM: Access Code: [email or web address] URL: [email or web address] Date: 09/27/2022 Prepared by: Lorayne Bender  Exercises - Tip Pinch with Putty  - 1 x daily - 7 x weekly - 3 sets - 10 reps - Key Pinch with Putty  - 1 x daily - 7 x weekly - 3 sets - 10 reps - Thumb MCP and IP Flexion with Putty  - 1 x daily - 7 x weekly - 3 sets - 10 reps - Circular Shoulder Pendulum with Table Support  - 1 x daily - 7 x weekly - 3 sets - 10 reps - Standing Single Arm Elbow Flexion with Resistance  - 1 x daily - 7 x weekly - 3 sets - 10 reps  ASSESSMENT:  CLINICAL IMPRESSION: The patient continues to progress. She had some pain in passive ER. We worked again on active ER for home. She has also been having some peri-scapular pain. She was given prone per-scapular exercises to work on. She has been doing a lot of shoulder forward flexion since she saw the MD. He adivsed her to do so, She devleoped some anterior shoulder soreness and bicpes soreness. We reviwwed how to do it from a supine position first. Beckie Busing will continue to progress as tolerated.   Eval: Patient is a 47 year old female status post right labral repair on 09/24/2022.  At this time her pain is well-controlled.  She presents with expected limitations in range of motion, strength, and functional mobility.  She is very active in martial arts prior to her surgery.  She would like to return to those activities.  She would also  like to begin resistance training.  She would benefit from skilled therapy to improve functional mobility of dominant arm. OBJECTIVE IMPAIRMENTS: decreased knowledge of condition, decreased ROM, decreased strength, increased muscle spasms, impaired UE functional use, and pain.   ACTIVITY LIMITATIONS: carrying, lifting, sleeping, bathing, dressing, self feeding, and reach  over head  PARTICIPATION LIMITATIONS: meal prep, cleaning, laundry, driving, shopping, community activity, occupation, and yard work  PERSONAL FACTORS: 1-2 comorbidities: recent knee surgery   are also affecting patient's functional outcome.   REHAB POTENTIAL: Excellent  CLINICAL DECISION MAKING: Stable/uncomplicated  EVALUATION COMPLEXITY: Low   GOALS: Goals reviewed with patient? Yes  SHORT TERM GOALS: Target date: 11/08/2022    Patient will increase passive right shoulder flexion to 90 degrees Baseline: Goal status: INITIAL  2.  Patient will increase passive shoulder external rotation to 30 degrees right Baseline:  Goal status: INITIAL  3.  Patient will wean out of sling when directed by MD Baseline:  Goal status: INITIAL  4.  Patient will be independent with base exercise program Baseline:  Goal status: INITIAL  LONG TERM GOALS: Target date: 12/21/2022      Patient will reach overhead with 2 pound weight without increased pain in order to perform ADLs Baseline:  Goal status: INITIAL  2.  Patient will reach behind her back without pain in order to perform ADLs Baseline:  Goal status: INITIAL  3.  Patient will reach behind her head without pain in order to perform ADLs Baseline:  Goal status: INITIAL  4.  Patient will return to light gym program when cleared by MD Baseline:  Goal status: INITIAL PLAN:  PT FREQUENCY: 2x/week  PT DURATION: 12 weeks  PLANNED INTERVENTIONS:  Therapeutic exercises, Therapeutic activity, Neuromuscular re-education, Balance training, Gait training,  Patient/Family education, Self Care, Joint mobilization, Stair training, DME instructions, Aquatic Therapy, Dry Needling, Electrical stimulation, Cryotherapy, Moist heat, Taping, Manual therapy, and Re-evaluation.   PLAN FOR NEXT SESSION: begin per shoulder instability protocol    Dessie Coma, PT 10/15/2022, 10:45 AM

## 2022-10-17 ENCOUNTER — Ambulatory Visit (HOSPITAL_BASED_OUTPATIENT_CLINIC_OR_DEPARTMENT_OTHER): Payer: 59 | Admitting: Physical Therapy

## 2022-10-22 ENCOUNTER — Encounter (HOSPITAL_BASED_OUTPATIENT_CLINIC_OR_DEPARTMENT_OTHER): Payer: Self-pay | Admitting: Physical Therapy

## 2022-10-22 ENCOUNTER — Ambulatory Visit (HOSPITAL_BASED_OUTPATIENT_CLINIC_OR_DEPARTMENT_OTHER): Payer: 59 | Admitting: Physical Therapy

## 2022-10-22 DIAGNOSIS — M25511 Pain in right shoulder: Secondary | ICD-10-CM

## 2022-10-22 DIAGNOSIS — M6281 Muscle weakness (generalized): Secondary | ICD-10-CM

## 2022-10-22 DIAGNOSIS — M25611 Stiffness of right shoulder, not elsewhere classified: Secondary | ICD-10-CM

## 2022-10-22 NOTE — Therapy (Signed)
OUTPATIENT PHYSICAL THERAPY SHOULDER EVALUATION   Patient Name: Crystal Parks MRN: [id number] DOB:21-May-1975, 47 y.o., female Today's Date: 10/22/2022  END OF SESSION:  PT End of Session - 10/22/22 1414     Visit Number 5    Number of Visits 16    Date for PT Re-Evaluation 12/21/22    Authorization Type UHC    PT Start Time 1403    Activity Tolerance Patient tolerated treatment well    Behavior During Therapy [facility] for tasks assessed/performed               Past Medical History:  Diagnosis Date   ADHD (attention deficit hyperactivity disorder)    Arthritis    bilateral hands/fingers   Complication of anesthesia    Woke up extremely cold after anesthesia. Required warm blankets.   Headache    Migraine   Hypothyroidism    Thyroid disease    Past Surgical History:  Procedure Laterality Date   ceas     c-section   KNEE ARTHROSCOPY WITH MENISCAL REPAIR Left 06/04/2022   Procedure: LEFT KNEE ARTHROSCOPY WITH MEDIAL MENISCAL REPAIR;  Surgeon: Huel Cote, MD;  Location: [facility] OR;  Service: Orthopedics;  Laterality: Left;   REDUCTION MAMMAPLASTY Bilateral    age 82   WISDOM TOOTH EXTRACTION     Patient Active Problem List   Diagnosis Date Noted   Acute medial meniscus tear of left knee 06/04/2022   Inappropriate sinus tachycardia 06/30/2018   Palpitations 06/30/2018   Acute cystitis with hematuria 11/21/2016   Hypothyroidism 11/29/2015   Impaired fasting glucose 11/29/2015   Myalgia 11/29/2015   Female pattern alopecia 11/15/2011   Telogen effluvium 11/15/2011   ALLERGIC RHINITIS 10/18/2009   ASTHMA 10/18/2009   Asthma 10/18/2009    [facility]   REFERRING PROVIDER: Dr Huel Cote   REFERRING DIAG: RIGHT SHOULDER LABRAL TEAR   THERAPY DIAG:  Right shoulder pain, unspecified chronicity  Stiffness of right shoulder, not elsewhere classified  Muscle weakness (generalized)  Rationale for Evaluation and Treatment:  Rehabilitation  ONSET DATE:   Days since surgery: 09/24/2022 Surgical date   PROCEDURE: 1.  Right shoulder anterior labral repair 2.  Right shoulder limited debridement   SUBJECTIVE:                                                                                                                                                                                      SUBJECTIVE STATEMENT: Pt states that the R arm/shoulder was painful over the weekend. The pain was around the pec, deltoid, and UT area.    Eval:Patient has long history of right shoulder instability and pain.  She had a right shoulder labral repair on 09/24/2022.  At this time her pain is well-controlled.  She is currently compliant with sling use.  She did recently have a meniscal repair.  Hand dominance: Right  PERTINENT HISTORY: Left knee meniscal repair   PAIN:  Are you having pain? Yes: NPRS scale: 1-2/10 Pain location: right anterior shoulder  Pain description: aching  Aggravating factors: rest  Relieving factors:   PRECAUTIONS: Shoulder  RED FLAGS: None   WEIGHT BEARING RESTRICTIONS: Yes NWB  FALLS:  Has patient fallen in last 6 months? 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   PLOF: Independent   Hobbies:  Martial arts  [employer] training  PATIENT GOALS:   NEXT MD VISIT:   OBJECTIVE:   DIAGNOSTIC FINDINGS:  Nothing post-op  PATIENT SURVEYS:  FOTO    COGNITION: Overall cognitive status: Within functional limits for tasks assessed     SENSATION: Some impaired sesation into her thumb   POSTURE: Good   UPPER EXTREMITY ROM:   Passive ROM Right eval Left eval  Shoulder flexion 80 without resistance    Shoulder extension    Shoulder abduction    Shoulder adduction    Shoulder internal rotation To stomach    Shoulder external rotation 4   Elbow flexion    Elbow extension    Wrist flexion    Wrist extension    Wrist ulnar  deviation    Wrist radial deviation    Wrist pronation    Wrist supination    (Blank rows = not tested)  UPPER EXTREMITY MMT:  MMT Right eval Left eval  Shoulder flexion    Shoulder extension    Shoulder abduction    Shoulder adduction    Shoulder internal rotation    Shoulder external rotation    Middle trapezius    Lower trapezius    Elbow flexion    Elbow extension    Wrist flexion    Wrist extension    Wrist ulnar deviation    Wrist radial deviation    Wrist pronation    Wrist supination    Grip strength (lbs)    (Blank rows = not tested)      PALPATION:  No unexpected TTP    TODAY'S TREATMENT:                                                                                                                                         DATE:   9/23  STM R UT, pec, deltoid PROM to protocol limits  Self STM and pain management options  9/16  Manual: PROM into protocol limits  Trigger point release to upper trap and posterior shoulder; Grade II and III PA and AP mobilization   Wand flex in pain free range 2x15 spent time reviewing pain free ranges and how to strengthen.  Prone row 2x12 Prone extension 2x10    Supine ER 3x10    Given for HEP   9/11 Manual: PROM into protocol limits  Trigger point release to upper trap and posterior shoulder   Wand press 2x15  Wand flex in pain free range 2x15   ER iso  x15   9/5 Manual: PROM into protocol limits   Self flexion x10 in sitting  Self ER x10 in sitting supported   Pulley 2 min with min cuing.   Scap retraction 2x20  Sling isometrics 10x 5 sec hold ER/IR Flexion      Eval:Access Code: [email or web address] URL: [email or web address] Date: 09/27/2022 Prepared by: Lorayne Bender  Exercises - Tip Pinch with Putty  - 1 x daily - 7 x weekly - 3 sets - 10 reps - Key Pinch with Putty  - 1 x daily - 7 x weekly - 3 sets - 10 reps - Thumb MCP and IP Flexion with Putty  - 1 x daily - 7 x weekly -  3 sets - 10 reps - Circular Shoulder Pendulum with Table Support  - 1 x daily - 7 x weekly - 3 sets - 10 reps - Standing Single Arm Elbow Flexion with Resistance  - 1 x daily - 7 x weekly - 3 sets - 10 reps  Manual: PROM into all planes per protocol    PATIENT EDUCATION: Education details: HEP, symptom management,  Person educated: Patient Education method: Explanation, Demonstration, Tactile cues, Verbal cues, and Handouts Education comprehension: verbalized understanding, returned demonstration, verbal cues required, tactile cues required, and needs further education  HOME EXERCISE PROGRAM: Access Code: [email or web address] URL: [email or web address] Date: 09/27/2022 Prepared by: Lorayne Bender  Exercises - Tip Pinch with Putty  - 1 x daily - 7 x weekly - 3 sets - 10 reps - Key Pinch with Putty  - 1 x daily - 7 x weekly - 3 sets - 10 reps - Thumb MCP and IP Flexion with Putty  - 1 x daily - 7 x weekly - 3 sets - 10 reps - Circular Shoulder Pendulum with Table Support  - 1 x daily - 7 x weekly - 3 sets - 10 reps - Standing Single Arm Elbow Flexion with Resistance  - 1 x daily - 7 x weekly - 3 sets - 10 reps  ASSESSMENT:  CLINICAL IMPRESSION: Pt 4 wks at this time. Pt presents with increase in R shoulder muscular pain from recent increase in A/AROM exercise. Pt had good tolerance to manual therapy with improvement of pain by end of session. Pt advised to keep same HEP at this time and shown self STM strategies. Pt's pain appears related to muscle overuse vs internal derangement. Plan to continue per protcool at next Pt would benefit from continued skilled therapy in order to reach goals and maximize functional R UE strength and ROM for return to PLOF.   Eval: Patient is a 47 year old female status post right labral repair on 09/24/2022.  At this time her pain is well-controlled.  She presents with expected limitations in range of motion, strength, and functional mobility.  She is  very active in martial arts prior to her surgery.  She would like to return to those activities.  She would also like to begin resistance training.  She would benefit from skilled therapy to improve functional mobility of dominant arm. OBJECTIVE IMPAIRMENTS: decreased knowledge of condition, decreased ROM, decreased strength, increased muscle spasms, impaired UE functional use, and pain.   ACTIVITY LIMITATIONS:  carrying, lifting, sleeping, bathing, dressing, self feeding, and reach over head  PARTICIPATION LIMITATIONS: meal prep, cleaning, laundry, driving, shopping, community activity, occupation, and yard work  PERSONAL FACTORS: 1-2 comorbidities: recent knee surgery   are also affecting patient's functional outcome.   REHAB POTENTIAL: Excellent  CLINICAL DECISION MAKING: Stable/uncomplicated  EVALUATION COMPLEXITY: Low   GOALS: Goals reviewed with patient? Yes  SHORT TERM GOALS: Target date: 11/08/2022    Patient will increase passive right shoulder flexion to 90 degrees Baseline: Goal status: INITIAL  2.  Patient will increase passive shoulder external rotation to 30 degrees right Baseline:  Goal status: INITIAL  3.  Patient will wean out of sling when directed by MD Baseline:  Goal status: INITIAL  4.  Patient will be independent with base exercise program Baseline:  Goal status: INITIAL  LONG TERM GOALS: Target date: 12/21/2022      Patient will reach overhead with 2 pound weight without increased pain in order to perform ADLs Baseline:  Goal status: INITIAL  2.  Patient will reach behind her back without pain in order to perform ADLs Baseline:  Goal status: INITIAL  3.  Patient will reach behind her head without pain in order to perform ADLs Baseline:  Goal status: INITIAL  4.  Patient will return to light gym program when cleared by MD Baseline:  Goal status: INITIAL PLAN:  PT FREQUENCY: 2x/week  PT DURATION: 12 weeks  PLANNED INTERVENTIONS:   Therapeutic exercises, Therapeutic activity, Neuromuscular re-education, Balance training, Gait training, Patient/Family education, Self Care, Joint mobilization, Stair training, DME instructions, Aquatic Therapy, Dry Needling, Electrical stimulation, Cryotherapy, Moist heat, Taping, Manual therapy, and Re-evaluation.   PLAN FOR NEXT SESSION: begin per shoulder instability protocol    Zebedee Iba, PT 10/22/2022, 2:46 PM

## 2022-10-25 ENCOUNTER — Encounter (HOSPITAL_BASED_OUTPATIENT_CLINIC_OR_DEPARTMENT_OTHER): Payer: 59 | Admitting: Physical Therapy

## 2022-10-29 ENCOUNTER — Ambulatory Visit (HOSPITAL_BASED_OUTPATIENT_CLINIC_OR_DEPARTMENT_OTHER): Payer: 59 | Admitting: Physical Therapy

## 2022-10-29 ENCOUNTER — Encounter (HOSPITAL_BASED_OUTPATIENT_CLINIC_OR_DEPARTMENT_OTHER): Payer: Self-pay | Admitting: Physical Therapy

## 2022-10-29 ENCOUNTER — Other Ambulatory Visit (HOSPITAL_BASED_OUTPATIENT_CLINIC_OR_DEPARTMENT_OTHER): Payer: Self-pay

## 2022-10-29 DIAGNOSIS — M6281 Muscle weakness (generalized): Secondary | ICD-10-CM

## 2022-10-29 DIAGNOSIS — M25511 Pain in right shoulder: Secondary | ICD-10-CM

## 2022-10-29 DIAGNOSIS — M25611 Stiffness of right shoulder, not elsewhere classified: Secondary | ICD-10-CM

## 2022-10-29 NOTE — Therapy (Signed)
OUTPATIENT PHYSICAL THERAPY SHOULDER TREATMENT   Patient Name: Crystal Parks MRN: [id number] DOB:09/24/1975, 47 y.o., female Today's Date: 10/29/2022  END OF SESSION:  PT End of Session - 10/29/22 1202     Visit Number 6    Number of Visits 16    Date for PT Re-Evaluation 12/21/22    Authorization Type UHC    PT Start Time 1146    PT Stop Time 1225    PT Time Calculation (min) 39 min    Activity Tolerance Patient tolerated treatment well    Behavior During Therapy [facility]FL for tasks assessed/performed                Past Medical History:  Diagnosis Date   ADHD (attention deficit hyperactivity disorder)    Arthritis    bilateral hands/fingers   Complication of anesthesia    Woke up extremely cold after anesthesia. Required warm blankets.   Headache    Migraine   Hypothyroidism    Thyroid disease    Past Surgical History:  Procedure Laterality Date   ceas     c-section   KNEE ARTHROSCOPY WITH MENISCAL REPAIR Left 06/04/2022   Procedure: LEFT KNEE ARTHROSCOPY WITH MEDIAL MENISCAL REPAIR;  Surgeon: Huel Cote, MD;  Location: [facility] OR;  Service: Orthopedics;  Laterality: Left;   REDUCTION MAMMAPLASTY Bilateral    age 69   WISDOM TOOTH EXTRACTION     Patient Active Problem List   Diagnosis Date Noted   Acute medial meniscus tear of left knee 06/04/2022   Inappropriate sinus tachycardia 06/30/2018   Palpitations 06/30/2018   Acute cystitis with hematuria 11/21/2016   Hypothyroidism 11/29/2015   Impaired fasting glucose 11/29/2015   Myalgia 11/29/2015   Female pattern alopecia 11/15/2011   Telogen effluvium 11/15/2011   ALLERGIC RHINITIS 10/18/2009   ASTHMA 10/18/2009   Asthma 10/18/2009    [facility]   REFERRING PROVIDER: Dr Huel Cote   REFERRING DIAG: RIGHT SHOULDER LABRAL TEAR   THERAPY DIAG:  Right shoulder pain, unspecified chronicity  Stiffness of right shoulder, not elsewhere classified  Muscle weakness  (generalized)  Rationale for Evaluation and Treatment: Rehabilitation  ONSET DATE:   Days since surgery: 09/24/2022 Surgical date    PROCEDURE: 1.  Right shoulder anterior labral repair 2.  Right shoulder limited debridement   SUBJECTIVE:                                                                                                                                                                                      SUBJECTIVE STATEMENT: Pt states she has stopped using the sling bc it hurts the back of the shoulder and  causing the trigger points. Has only had it off for 2 days. Her infra still continues to hurt.    Eval:Patient has long history of right shoulder instability and pain.  She had a right shoulder labral repair on 09/24/2022.  At this time her pain is well-controlled.  She is currently compliant with sling use.  She did recently have a meniscal repair.  Hand dominance: Right  PERTINENT HISTORY: Left knee meniscal repair   PAIN:  Are you having pain? Yes: NPRS scale: 1-2/10 Pain location: right anterior shoulder  Pain description: aching  Aggravating factors: rest  Relieving factors:   PRECAUTIONS: Shoulder  RED FLAGS: None   WEIGHT BEARING RESTRICTIONS: Yes NWB  FALLS:  Has patient fallen in last 6 months? 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   PLOF: Independent   Hobbies:  Martial arts  [employer] training  PATIENT GOALS:   NEXT MD VISIT:   OBJECTIVE:   DIAGNOSTIC FINDINGS:  Nothing post-op  PATIENT SURVEYS:  FOTO    COGNITION: Overall cognitive status: Within functional limits for tasks assessed     SENSATION: Some impaired sesation into her thumb   POSTURE: Good   UPPER EXTREMITY ROM:   Passive ROM Right eval Left eval  Shoulder flexion 80 without resistance    Shoulder extension    Shoulder abduction    Shoulder adduction    Shoulder internal rotation To stomach     Shoulder external rotation 4   Elbow flexion    Elbow extension    Wrist flexion    Wrist extension    Wrist ulnar deviation    Wrist radial deviation    Wrist pronation    Wrist supination    (Blank rows = not tested)  UPPER EXTREMITY MMT:  MMT Right eval Left eval  Shoulder flexion    Shoulder extension    Shoulder abduction    Shoulder adduction    Shoulder internal rotation    Shoulder external rotation    Middle trapezius    Lower trapezius    Elbow flexion    Elbow extension    Wrist flexion    Wrist extension    Wrist ulnar deviation    Wrist radial deviation    Wrist pronation    Wrist supination    Grip strength (lbs)    (Blank rows = not tested)      PALPATION:  No unexpected TTP    TODAY'S TREATMENT:                                                                                                                                         DATE:   9/30  STM R UT, deltoid, infra  PROM to tolerance 4-8 wks protocol limits( flexion 160, ABD 160, ER to 45)   Cane ABD 2x10 Pec stretch attempted but too  painful Self STM for rear delt   9/23  STM R UT, pec, deltoid PROM to protocol limits  Self STM and pain management options  9/16  Manual: PROM into protocol limits  Trigger point release to upper trap and posterior shoulder; Grade II and III PA and AP mobilization   Wand flex in pain free range 2x15 spent time reviewing pain free ranges and how to strengthen.  Prone row 2x12 Prone extension 2x10    Supine ER 3x10    Given for HEP   9/11 Manual: PROM into protocol limits  Trigger point release to upper trap and posterior shoulder   Wand press 2x15  Wand flex in pain free range 2x15   ER iso  x15   9/5 Manual: PROM into protocol limits   Self flexion x10 in sitting  Self ER x10 in sitting supported   Pulley 2 min with min cuing.   Scap retraction 2x20  Sling isometrics 10x 5 sec hold ER/IR Flexion      Eval:Access  Code: [email or web address] URL: [email or web address] Date: 09/27/2022 Prepared by: Lorayne Bender  Exercises - Tip Pinch with Putty  - 1 x daily - 7 x weekly - 3 sets - 10 reps - Key Pinch with Putty  - 1 x daily - 7 x weekly - 3 sets - 10 reps - Thumb MCP and IP Flexion with Putty  - 1 x daily - 7 x weekly - 3 sets - 10 reps - Circular Shoulder Pendulum with Table Support  - 1 x daily - 7 x weekly - 3 sets - 10 reps - Standing Single Arm Elbow Flexion with Resistance  - 1 x daily - 7 x weekly - 3 sets - 10 reps  Manual: PROM into all planes per protocol    PATIENT EDUCATION: Education details: HEP, symptom management,  Person educated: Patient Education method: Explanation, Demonstration, Tactile cues, Verbal cues, and Handouts Education comprehension: verbalized understanding, returned demonstration, verbal cues required, tactile cues required, and needs further education  HOME EXERCISE PROGRAM: Access Code: [email or web address] URL: [email or web address] Date: 09/27/2022 Prepared by: Lorayne Bender  Exercises - Tip Pinch with Putty  - 1 x daily - 7 x weekly - 3 sets - 10 reps - Key Pinch with Putty  - 1 x daily - 7 x weekly - 3 sets - 10 reps - Thumb MCP and IP Flexion with Putty  - 1 x daily - 7 x weekly - 3 sets - 10 reps - Circular Shoulder Pendulum with Table Support  - 1 x daily - 7 x weekly - 3 sets - 10 reps - Standing Single Arm Elbow Flexion with Resistance  - 1 x daily - 7 x weekly - 3 sets - 10 reps  ASSESSMENT:  CLINICAL IMPRESSION: Pt 5 wks at this time. Pt continues to have increase in R posterior should pain that limits A/AROM in frontal and sagittal plane. Pt did have improvement of pain with STM but may require TPDN at next session. Pt was unable to reach protocol limits with A/PROM today. HEP updated today with AAROM ABD and pec stretching for pt to attempt when pain free. No crossbody ADD per protocol until 6 wks so post capsule stretch on hold. Plan to  continue per protcool at next. Pt would benefit from continued skilled therapy in order to reach goals and maximize functional R UE strength and ROM for return to PLOF.   Eval: Patient is a 47 year old female status post right labral repair  on 09/24/2022.  At this time her pain is well-controlled.  She presents with expected limitations in range of motion, strength, and functional mobility.  She is very active in martial arts prior to her surgery.  She would like to return to those activities.  She would also like to begin resistance training.  She would benefit from skilled therapy to improve functional mobility of dominant arm. OBJECTIVE IMPAIRMENTS: decreased knowledge of condition, decreased ROM, decreased strength, increased muscle spasms, impaired UE functional use, and pain.   ACTIVITY LIMITATIONS: carrying, lifting, sleeping, bathing, dressing, self feeding, and reach over head  PARTICIPATION LIMITATIONS: meal prep, cleaning, laundry, driving, shopping, community activity, occupation, and yard work  PERSONAL FACTORS: 1-2 comorbidities: recent knee surgery   are also affecting patient's functional outcome.   REHAB POTENTIAL: Excellent  CLINICAL DECISION MAKING: Stable/uncomplicated  EVALUATION COMPLEXITY: Low   GOALS: Goals reviewed with patient? Yes  SHORT TERM GOALS: Target date: 11/08/2022    Patient will increase passive right shoulder flexion to 90 degrees Baseline: Goal status: INITIAL  2.  Patient will increase passive shoulder external rotation to 30 degrees right Baseline:  Goal status: INITIAL  3.  Patient will wean out of sling when directed by MD Baseline:  Goal status: INITIAL  4.  Patient will be independent with base exercise program Baseline:  Goal status: INITIAL  LONG TERM GOALS: Target date: 12/21/2022      Patient will reach overhead with 2 pound weight without increased pain in order to perform ADLs Baseline:  Goal status: INITIAL  2.   Patient will reach behind her back without pain in order to perform ADLs Baseline:  Goal status: INITIAL  3.  Patient will reach behind her head without pain in order to perform ADLs Baseline:  Goal status: INITIAL  4.  Patient will return to light gym program when cleared by MD Baseline:  Goal status: INITIAL PLAN:  PT FREQUENCY: 2x/week  PT DURATION: 12 weeks  PLANNED INTERVENTIONS:  Therapeutic exercises, Therapeutic activity, Neuromuscular re-education, Balance training, Gait training, Patient/Family education, Self Care, Joint mobilization, Stair training, DME instructions, Aquatic Therapy, Dry Needling, Electrical stimulation, Cryotherapy, Moist heat, Taping, Manual therapy, and Re-evaluation.   PLAN FOR NEXT SESSION: begin per shoulder instability protocol    Zebedee Iba, PT 10/29/2022, 12:45 PM

## 2022-11-01 ENCOUNTER — Encounter (HOSPITAL_BASED_OUTPATIENT_CLINIC_OR_DEPARTMENT_OTHER): Payer: 59 | Admitting: Physical Therapy

## 2022-11-05 ENCOUNTER — Encounter (HOSPITAL_BASED_OUTPATIENT_CLINIC_OR_DEPARTMENT_OTHER): Payer: Self-pay | Admitting: Orthopaedic Surgery

## 2022-11-05 ENCOUNTER — Ambulatory Visit (HOSPITAL_BASED_OUTPATIENT_CLINIC_OR_DEPARTMENT_OTHER): Payer: 59 | Attending: Orthopaedic Surgery | Admitting: Physical Therapy

## 2022-11-05 ENCOUNTER — Encounter (HOSPITAL_BASED_OUTPATIENT_CLINIC_OR_DEPARTMENT_OTHER): Payer: Self-pay | Admitting: Physical Therapy

## 2022-11-05 DIAGNOSIS — M25562 Pain in left knee: Secondary | ICD-10-CM

## 2022-11-05 DIAGNOSIS — R262 Difficulty in walking, not elsewhere classified: Secondary | ICD-10-CM | POA: Diagnosis present

## 2022-11-05 DIAGNOSIS — M6281 Muscle weakness (generalized): Secondary | ICD-10-CM

## 2022-11-05 DIAGNOSIS — M25511 Pain in right shoulder: Secondary | ICD-10-CM | POA: Diagnosis present

## 2022-11-05 DIAGNOSIS — M25611 Stiffness of right shoulder, not elsewhere classified: Secondary | ICD-10-CM | POA: Diagnosis present

## 2022-11-05 NOTE — Therapy (Signed)
OUTPATIENT PHYSICAL THERAPY SHOULDER TREATMENT   Patient Name: Crystal Parks MRN: [id number] DOB:[birth year], 47 y.o., female Today's Date: 11/05/2022  END OF SESSION:  PT End of Session - 11/05/22 1521     Visit Number 7    Number of Visits 16    Date for PT Re-Evaluation 12/21/22    Authorization Type U[employer]    PT Start Time 1515    PT Stop Time 1558    PT Time Calculation (min) 43 min    Activity Tolerance Patient tolerated treatment well    Behavior During Therapy WFL for tasks assessed/performed                Past Medical History:  Diagnosis Date   ADHD (attention deficit hyperactivity disorder)    Arthritis    bilateral hands/fingers   Complication of anesthesia    Woke up extremely cold after anesthesia. Required warm blankets.   Headache    Migraine   Hypothyroidism    Thyroid disease    Past Surgical History:  Procedure Laterality Date   ceas     c-section   KNEE ARTHROSCOPY WITH MENISCAL REPAIR Left 06/04/2022   Procedure: LEFT KNEE ARTHROSCOPY WITH MEDIAL MENISCAL REPAIR;  Surgeon: Huel Cote, MD;  Location: [facility] OR;  Service: Orthopedics;  Laterality: Left;   REDUCTION MAMMAPLASTY Bilateral    age 47   WISDOM TOOTH EXTRACTION     Patient Active Problem List   Diagnosis Date Noted   Acute medial meniscus tear of left knee 06/04/2022   Inappropriate sinus tachycardia (HCC) 06/30/2018   Palpitations 06/30/2018   Acute cystitis with hematuria 11/21/2016   Hypothyroidism 11/29/2015   Impaired fasting glucose 11/29/2015   Myalgia 11/29/2015   Female pattern alopecia 11/15/2011   Telogen effluvium 11/15/2011   ALLERGIC RHINITIS 10/18/2009   ASTHMA 10/18/2009   Asthma 10/18/2009    [facility]   REFERRING PROVIDER: Dr Huel Cote   REFERRING DIAG: RIGHT SHOULDER LABRAL TEAR   THERAPY DIAG:  Right shoulder pain, unspecified chronicity  Stiffness of right shoulder, not elsewhere classified  Muscle weakness  (generalized)  Difficulty in walking, not elsewhere classified  Acute pain of left knee  Rationale for Evaluation and Treatment: Rehabilitation  ONSET DATE:   Days since surgery: 09/24/2022 Surgical date    PROCEDURE: 1.  Right shoulder anterior labral repair 2.  Right shoulder limited debridement   SUBJECTIVE:                                                                                                                                                                                      SUBJECTIVE STATEMENT: The patient reports  continued posterior pain. She has been working on her exercises but it hasn't improved much    Eval:Patient has long history of right shoulder instability and pain.  She had a right shoulder labral repair on 09/24/2022.  At this time her pain is well-controlled.  She is currently compliant with sling use.  She did recently have a meniscal repair.  Hand dominance: Right  PERTINENT HISTORY: Left knee meniscal repair   PAIN:  Are you having pain? Yes: NPRS scale: 1-2/10 Pain location: right anterior shoulder  Pain description: aching  Aggravating factors: rest  Relieving factors:   PRECAUTIONS: Shoulder  RED FLAGS: None   WEIGHT BEARING RESTRICTIONS: Yes NWB  FALLS:  Has patient fallen in last 6 months? 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   PLOF: Independent   Hobbies:  Martial arts  [employer] training  PATIENT GOALS:   NEXT MD VISIT:   OBJECTIVE:   DIAGNOSTIC FINDINGS:  Nothing post-op  PATIENT SURVEYS:  FOTO    COGNITION: Overall cognitive status: Within functional limits for tasks assessed     SENSATION: Some impaired sesation into her thumb   POSTURE: Good   UPPER EXTREMITY ROM:   Passive ROM Right eval Left eval  Shoulder flexion 80 without resistance    Shoulder extension    Shoulder abduction    Shoulder adduction    Shoulder internal rotation  To stomach    Shoulder external rotation 4   Elbow flexion    Elbow extension    Wrist flexion    Wrist extension    Wrist ulnar deviation    Wrist radial deviation    Wrist pronation    Wrist supination    (Blank rows = not tested)  UPPER EXTREMITY MMT:  MMT Right eval Left eval  Shoulder flexion    Shoulder extension    Shoulder abduction    Shoulder adduction    Shoulder internal rotation    Shoulder external rotation    Middle trapezius    Lower trapezius    Elbow flexion    Elbow extension    Wrist flexion    Wrist extension    Wrist ulnar deviation    Wrist radial deviation    Wrist pronation    Wrist supination    Grip strength (lbs)    (Blank rows = not tested)      PALPATION:  No unexpected TTP    TODAY'S TREATMENT:                                                                                                                                         DATE:  10/7 Trigger Point Dry-Needling  Treatment instructions: Expect mild to moderate muscle soreness. S/S of pneumothorax if dry needled over a lung field, and to seek immediate medical attention should they occur. Patient verbalized understanding of  these instructions and education.  Patient Consent Given: Yes Education handout provided: Yes Muscles treated: sub-scap 2x with a .30x50 needle teres minor, 1x with .30x50 needle upper trap 2x with a .30x50 needle  Electrical stimulation performed: No Parameters: N/A Treatment response/outcome: great twitch   Wand er 3x 5 5 sec hold  Wand flexion 3x 5 sec hols       9/30  STM R UT, deltoid, infra  PROM to tolerance 4-8 wks protocol limits( flexion 160, ABD 160, ER to 45)   Cane ABD 2x10 Pec stretch attempted but too painful Self STM for rear delt   9/23  STM R UT, pec, deltoid PROM to protocol limits  Self STM and pain management options  9/16  Manual: PROM into protocol limits  Trigger point release to upper trap and posterior  shoulder; Grade II and III PA and AP mobilization   Wand flex in pain free range 2x15 spent time reviewing pain free ranges and how to strengthen.  Prone row 2x12 Prone extension 2x10    Supine ER 3x10    Given for HEP   9/11 Manual: PROM into protocol limits  Trigger point release to upper trap and posterior shoulder   Wand press 2x15  Wand flex in pain free range 2x15   ER iso  x15   9/5 Manual: PROM into protocol limits   Self flexion x10 in sitting  Self ER x10 in sitting supported   Pulley 2 min with min cuing.   Scap retraction 2x20  Sling isometrics 10x 5 sec hold ER/IR Flexion      Eval:Access Code: [email or web address] URL: [email or web address] Date: 09/27/2022 Prepared by: Lorayne Bender  Exercises - Tip Pinch with Putty  - 1 x daily - 7 x weekly - 3 sets - 10 reps - Key Pinch with Putty  - 1 x daily - 7 x weekly - 3 sets - 10 reps - Thumb MCP and IP Flexion with Putty  - 1 x daily - 7 x weekly - 3 sets - 10 reps - Circular Shoulder Pendulum with Table Support  - 1 x daily - 7 x weekly - 3 sets - 10 reps - Standing Single Arm Elbow Flexion with Resistance  - 1 x daily - 7 x weekly - 3 sets - 10 reps  Manual: PROM into all planes per protocol    PATIENT EDUCATION: Education details: HEP, symptom management,  Person educated: Patient Education method: Explanation, Demonstration, Tactile cues, Verbal cues, and Handouts Education comprehension: verbalized understanding, returned demonstration, verbal cues required, tactile cues required, and needs further education  HOME EXERCISE PROGRAM: Access Code: [email or web address] URL: [email or web address] Date: 09/27/2022 Prepared by: Lorayne Bender  Exercises - Tip Pinch with Putty  - 1 x daily - 7 x weekly - 3 sets - 10 reps - Key Pinch with Putty  - 1 x daily - 7 x weekly - 3 sets - 10 reps - Thumb MCP and IP Flexion with Putty  - 1 x daily - 7 x weekly - 3 sets - 10 reps - Circular Shoulder  Pendulum with Table Support  - 1 x daily - 7 x weekly - 3 sets - 10 reps - Standing Single Arm Elbow Flexion with Resistance  - 1 x daily - 7 x weekly - 3 sets - 10 reps  ASSESSMENT:  CLINICAL IMPRESSION: The patient is 6 weeks post op. She continues to have significant trigger points in her posterior shoulder. We focused on needling and manual  therapy today. She had limited ER. She had more ER with AAROM then with passive. She is guarded in ER.  She was shown a wand stretch and advised to continue working on it over the next few weeks. She will see the MD next week or sooner.  Eval: Patient is a 47 year old female status post right labral repair on 09/24/2022.  At this time her pain is well-controlled.  She presents with expected limitations in range of motion, strength, and functional mobility.  She is very active in martial arts prior to her surgery.  She would like to return to those activities.  She would also like to begin resistance training.  She would benefit from skilled therapy to improve functional mobility of dominant arm. OBJECTIVE IMPAIRMENTS: decreased knowledge of condition, decreased ROM, decreased strength, increased muscle spasms, impaired UE functional use, and pain.   ACTIVITY LIMITATIONS: carrying, lifting, sleeping, bathing, dressing, self feeding, and reach over head  PARTICIPATION LIMITATIONS: meal prep, cleaning, laundry, driving, shopping, community activity, occupation, and yard work  PERSONAL FACTORS: 1-2 comorbidities: recent knee surgery   are also affecting patient's functional outcome.   REHAB POTENTIAL: Excellent  CLINICAL DECISION MAKING: Stable/uncomplicated  EVALUATION COMPLEXITY: Low   GOALS: Goals reviewed with patient? Yes  SHORT TERM GOALS: Target date: 11/08/2022    Patient will increase passive right shoulder flexion to 90 degrees Baseline: Goal status: INITIAL  2.  Patient will increase passive shoulder external rotation to 30 degrees  right Baseline:  Goal status: INITIAL  3.  Patient will wean out of sling when directed by MD Baseline:  Goal status: INITIAL  4.  Patient will be independent with base exercise program Baseline:  Goal status: INITIAL  LONG TERM GOALS: Target date: 12/21/2022      Patient will reach overhead with 2 pound weight without increased pain in order to perform ADLs Baseline:  Goal status: INITIAL  2.  Patient will reach behind her back without pain in order to perform ADLs Baseline:  Goal status: INITIAL  3.  Patient will reach behind her head without pain in order to perform ADLs Baseline:  Goal status: INITIAL  4.  Patient will return to light gym program when cleared by MD Baseline:  Goal status: INITIAL PLAN:  PT FREQUENCY: 2x/week  PT DURATION: 12 weeks  PLANNED INTERVENTIONS:  Therapeutic exercises, Therapeutic activity, Neuromuscular re-education, Balance training, Gait training, Patient/Family education, Self Care, Joint mobilization, Stair training, DME instructions, Aquatic Therapy, Dry Needling, Electrical stimulation, Cryotherapy, Moist heat, Taping, Manual therapy, and Re-evaluation.   PLAN FOR NEXT SESSION: begin per shoulder instability protocol    Dessie Coma, PT 11/05/2022, 3:23 PM

## 2022-11-06 ENCOUNTER — Encounter (HOSPITAL_BASED_OUTPATIENT_CLINIC_OR_DEPARTMENT_OTHER): Payer: Self-pay | Admitting: Physical Therapy

## 2022-11-08 ENCOUNTER — Ambulatory Visit (HOSPITAL_BASED_OUTPATIENT_CLINIC_OR_DEPARTMENT_OTHER): Payer: 59 | Admitting: Physical Therapy

## 2022-11-08 DIAGNOSIS — M25611 Stiffness of right shoulder, not elsewhere classified: Secondary | ICD-10-CM

## 2022-11-08 DIAGNOSIS — M25511 Pain in right shoulder: Secondary | ICD-10-CM | POA: Diagnosis not present

## 2022-11-08 DIAGNOSIS — M6281 Muscle weakness (generalized): Secondary | ICD-10-CM

## 2022-11-08 NOTE — Therapy (Signed)
OUTPATIENT PHYSICAL THERAPY SHOULDER TREATMENT   Patient Name: Crystal Parks MRN: [id number] DOB:08/10/1975, 47 y.o., female Today's Date: 11/09/2022  END OF SESSION:  PT End of Session - 11/09/22 0827     Visit Number 8    Number of Visits 16    Date for PT Re-Evaluation 12/21/22    Authorization Type UHC    PT Start Time 1517    PT Stop Time 1558    PT Time Calculation (min) 41 min    Activity Tolerance Patient tolerated treatment well    Behavior During Therapy WFL for tasks assessed/performed                 Past Medical History:  Diagnosis Date   ADHD (attention deficit hyperactivity disorder)    Arthritis    bilateral hands/fingers   Complication of anesthesia    Woke up extremely cold after anesthesia. Required warm blankets.   Headache    Migraine   Hypothyroidism    Thyroid disease    Past Surgical History:  Procedure Laterality Date   ceas     c-section   KNEE ARTHROSCOPY WITH MENISCAL REPAIR Left 06/04/2022   Procedure: LEFT KNEE ARTHROSCOPY WITH MEDIAL MENISCAL REPAIR;  Surgeon: Huel Cote, MD;  Location: [facility] OR;  Service: Orthopedics;  Laterality: Left;   REDUCTION MAMMAPLASTY Bilateral    age 47   WISDOM TOOTH EXTRACTION     Patient Active Problem List   Diagnosis Date Noted   Acute medial meniscus tear of left knee 06/04/2022   Inappropriate sinus tachycardia (HCC) 06/30/2018   Palpitations 06/30/2018   Acute cystitis with hematuria 11/21/2016   Hypothyroidism 11/29/2015   Impaired fasting glucose 11/29/2015   Myalgia 11/29/2015   Female pattern alopecia 11/15/2011   Telogen effluvium 11/15/2011   ALLERGIC RHINITIS 10/18/2009   ASTHMA 10/18/2009   Asthma 10/18/2009    [facility]   REFERRING PROVIDER: Dr Huel Cote   REFERRING DIAG: RIGHT SHOULDER LABRAL TEAR   THERAPY DIAG:  Right shoulder pain, unspecified chronicity  Stiffness of right shoulder, not elsewhere classified  Muscle weakness  (generalized)  Rationale for Evaluation and Treatment: Rehabilitation  ONSET DATE:   Days since surgery: 09/24/2022 Surgical date    PROCEDURE: 1.  Right shoulder anterior labral repair 2.  Right shoulder limited debridement   SUBJECTIVE:                                                                                                                                                                                      SUBJECTIVE STATEMENT: The patient has had increased pain since her prior visit.  She reports initial benefit  from the trigger point dry needling.  She reports her exercises been causing her increased pain.  She feels significant pain down her posterior shoulder.  She is seen the MD tomorrow.   Eval:Patient has long history of right shoulder instability and pain.  She had a right shoulder labral repair on 09/24/2022.  At this time her pain is well-controlled.  She is currently compliant with sling use.  She did recently have a meniscal repair.  Hand dominance: Right  PERTINENT HISTORY: Left knee meniscal repair   PAIN:  Are you having pain? Yes: NPRS scale: 7/10 at worst  Pain location: right anterior shoulder  Pain description: aching  Aggravating factors: rest  Relieving factors:   PRECAUTIONS: Shoulder  RED FLAGS: None   WEIGHT BEARING RESTRICTIONS: Yes NWB  FALLS:  Has patient fallen in last 6 months? 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   PLOF: Independent   Hobbies:  Martial arts  [employer] training  PATIENT GOALS:   NEXT MD VISIT:   OBJECTIVE:   DIAGNOSTIC FINDINGS:  Nothing post-op  PATIENT SURVEYS:  FOTO    COGNITION: Overall cognitive status: Within functional limits for tasks assessed     SENSATION: Some impaired sesation into her thumb   POSTURE: Good   UPPER EXTREMITY ROM:   Passive ROM Right eval Left eval  Shoulder flexion 80 without resistance    Shoulder  extension    Shoulder abduction    Shoulder adduction    Shoulder internal rotation To stomach    Shoulder external rotation 4   Elbow flexion    Elbow extension    Wrist flexion    Wrist extension    Wrist ulnar deviation    Wrist radial deviation    Wrist pronation    Wrist supination    (Blank rows = not tested)  UPPER EXTREMITY MMT:  MMT Right eval Left eval  Shoulder flexion    Shoulder extension    Shoulder abduction    Shoulder adduction    Shoulder internal rotation    Shoulder external rotation    Middle trapezius    Lower trapezius    Elbow flexion    Elbow extension    Wrist flexion    Wrist extension    Wrist ulnar deviation    Wrist radial deviation    Wrist pronation    Wrist supination    Grip strength (lbs)    (Blank rows = not tested)      PALPATION:  No unexpected TTP    TODAY'S TREATMENT:                                                                                                                                         DATE:  10/10  Manual: trigger point release to upper trap/ deltoid/ Posterior shoulder; inferior and posterior glides grade  2 and 3 to reduce pain and improve movement.  Pec release.  Slow rhythmic ER IR in pain-free ranges.     10/7 Trigger Point Dry-Needling  Treatment instructions: Expect mild to moderate muscle soreness. S/S of pneumothorax if dry needled over a lung field, and to seek immediate medical attention should they occur. Patient verbalized understanding of these instructions and education.  Patient Consent Given: Yes Education handout provided: Yes Muscles treated: sub-scap 2x with a .30x50 needle teres minor, 1x with .30x50 needle upper trap 2x with a .30x50 needle  Electrical stimulation performed: No Parameters: N/A Treatment response/outcome: great twitch   Wand er 3x 5 5 sec hold  Wand flexion 3x 5 sec hols       9/30  STM R UT, deltoid, infra  PROM to tolerance 4-8 wks protocol  limits( flexion 160, ABD 160, ER to 45)   Cane ABD 2x10 Pec stretch attempted but too painful Self STM for rear delt   9/23  STM R UT, pec, deltoid PROM to protocol limits  Self STM and pain management options  9/16  Manual: PROM into protocol limits  Trigger point release to upper trap and posterior shoulder; Grade II and III PA and AP mobilization   Wand flex in pain free range 2x15 spent time reviewing pain free ranges and how to strengthen.  Prone row 2x12 Prone extension 2x10    Supine ER 3x10    Given for HEP   9/11 Manual: PROM into protocol limits  Trigger point release to upper trap and posterior shoulder   Wand press 2x15  Wand flex in pain free range 2x15   ER iso  x15   9/5 Manual: PROM into protocol limits   Self flexion x10 in sitting  Self ER x10 in sitting supported   Pulley 2 min with min cuing.   Scap retraction 2x20  Sling isometrics 10x 5 sec hold ER/IR Flexion      Eval:Access Code: [email or web address] URL: [email or web address] Date: 09/27/2022 Prepared by: Lorayne Bender  Exercises - Tip Pinch with Putty  - 1 x daily - 7 x weekly - 3 sets - 10 reps - Key Pinch with Putty  - 1 x daily - 7 x weekly - 3 sets - 10 reps - Thumb MCP and IP Flexion with Putty  - 1 x daily - 7 x weekly - 3 sets - 10 reps - Circular Shoulder Pendulum with Table Support  - 1 x daily - 7 x weekly - 3 sets - 10 reps - Standing Single Arm Elbow Flexion with Resistance  - 1 x daily - 7 x weekly - 3 sets - 10 reps  Manual: PROM into all planes per protocol    PATIENT EDUCATION: Education details: HEP, symptom management,  Person educated: Patient Education method: Explanation, Demonstration, Tactile cues, Verbal cues, and Handouts Education comprehension: verbalized understanding, returned demonstration, verbal cues required, tactile cues required, and needs further education  HOME EXERCISE PROGRAM: Access Code: [email or web address] URL:  [email or web address] Date: 09/27/2022 Prepared by: Lorayne Bender  Exercises - Tip Pinch with Putty  - 1 x daily - 7 x weekly - 3 sets - 10 reps - Key Pinch with Putty  - 1 x daily - 7 x weekly - 3 sets - 10 reps - Thumb MCP and IP Flexion with Putty  - 1 x daily - 7 x weekly - 3 sets - 10 reps - Circular Shoulder Pendulum with Table Support  - 1 x daily - 7  x weekly - 3 sets - 10 reps - Standing Single Arm Elbow Flexion with Resistance  - 1 x daily - 7 x weekly - 3 sets - 10 reps  ASSESSMENT:  CLINICAL IMPRESSION: Patient was advised to hold off her exercises until she sees the MD.  Her ER was tight upon arrival but had a significant improvement with light movement and stretching.  She continues to have significant spasming of her upper trap extending into her deltoid and bicep.  She is very guarded with movement.  She is advised to movement and ranges that does not cause increased pain to reduce guarding.  She would benefit from further trigger point dry needling but held today secondary to patient having likely injection tomorrow.  She may go to an independent needle her prior to her work trip.  We will see her when she goes back from her work trip.  She was advised if the shot is beneficial to begin light exercises when advised by MD.   Rhys Martini: Patient is a 47 year old female status post right labral repair on 09/24/2022.  At this time her pain is well-controlled.  She presents with expected limitations in range of motion, strength, and functional mobility.  She is very active in martial arts prior to her surgery.  She would like to return to those activities.  She would also like to begin resistance training.  She would benefit from skilled therapy to improve functional mobility of dominant arm. OBJECTIVE IMPAIRMENTS: decreased knowledge of condition, decreased ROM, decreased strength, increased muscle spasms, impaired UE functional use, and pain.   ACTIVITY LIMITATIONS:  carrying, lifting, sleeping, bathing, dressing, self feeding, and reach over head  PARTICIPATION LIMITATIONS: meal prep, cleaning, laundry, driving, shopping, community activity, occupation, and yard work  PERSONAL FACTORS: 1-2 comorbidities: recent knee surgery   are also affecting patient's functional outcome.   REHAB POTENTIAL: Excellent  CLINICAL DECISION MAKING: Stable/uncomplicated  EVALUATION COMPLEXITY: Low   GOALS: Goals reviewed with patient? Yes  SHORT TERM GOALS: Target date: 11/08/2022    Patient will increase passive right shoulder flexion to 90 degrees Baseline: Goal status: INITIAL  2.  Patient will increase passive shoulder external rotation to 30 degrees right Baseline:  Goal status: INITIAL  3.  Patient will wean out of sling when directed by MD Baseline:  Goal status: INITIAL  4.  Patient will be independent with base exercise program Baseline:  Goal status: INITIAL  LONG TERM GOALS: Target date: 12/21/2022      Patient will reach overhead with 2 pound weight without increased pain in order to perform ADLs Baseline:  Goal status: INITIAL  2.  Patient will reach behind her back without pain in order to perform ADLs Baseline:  Goal status: INITIAL  3.  Patient will reach behind her head without pain in order to perform ADLs Baseline:  Goal status: INITIAL  4.  Patient will return to light gym program when cleared by MD Baseline:  Goal status: INITIAL PLAN:  PT FREQUENCY: 2x/week  PT DURATION: 12 weeks  PLANNED INTERVENTIONS:  Therapeutic exercises, Therapeutic activity, Neuromuscular re-education, Balance training, Gait training, Patient/Family education, Self Care, Joint mobilization, Stair training, DME instructions, Aquatic Therapy, Dry Needling, Electrical stimulation, Cryotherapy, Moist heat, Taping, Manual therapy, and Re-evaluation.   PLAN FOR NEXT SESSION: begin per shoulder instability protocol    Dessie Coma,  PT 11/09/2022, 8:28 AM

## 2022-11-09 ENCOUNTER — Ambulatory Visit (HOSPITAL_BASED_OUTPATIENT_CLINIC_OR_DEPARTMENT_OTHER): Payer: 59 | Admitting: Orthopaedic Surgery

## 2022-11-09 ENCOUNTER — Other Ambulatory Visit (HOSPITAL_BASED_OUTPATIENT_CLINIC_OR_DEPARTMENT_OTHER): Payer: Self-pay

## 2022-11-09 ENCOUNTER — Encounter (HOSPITAL_BASED_OUTPATIENT_CLINIC_OR_DEPARTMENT_OTHER): Payer: Self-pay | Admitting: Physical Therapy

## 2022-11-09 DIAGNOSIS — M7501 Adhesive capsulitis of right shoulder: Secondary | ICD-10-CM | POA: Diagnosis not present

## 2022-11-09 MED ORDER — TRIAMCINOLONE ACETONIDE 40 MG/ML IJ SUSP
80.0000 mg | INTRAMUSCULAR | Status: AC | PRN
Start: 2022-11-09 — End: 2022-11-09
  Administered 2022-11-09: 80 mg via INTRA_ARTICULAR

## 2022-11-09 MED ORDER — LIDOCAINE HCL 1 % IJ SOLN
4.0000 mL | INTRAMUSCULAR | Status: AC | PRN
Start: 2022-11-09 — End: 2022-11-09
  Administered 2022-11-09: 4 mL

## 2022-11-09 MED ORDER — MELOXICAM 15 MG PO TABS
15.0000 mg | ORAL_TABLET | Freq: Every day | ORAL | 0 refills | Status: AC
  Filled 2022-11-09: qty 14, 14d supply, fill #0

## 2022-11-09 MED ORDER — OXYCODONE HCL 5 MG PO TABS
5.0000 mg | ORAL_TABLET | ORAL | 0 refills | Status: DC | PRN
Start: 2022-11-09 — End: 2023-01-17
  Filled 2022-11-09: qty 5, 1d supply, fill #0

## 2022-11-09 NOTE — Progress Notes (Signed)
Post Operative Evaluation    Procedure/Date of Surgery: Status post right shoulder labral repair 8/26  Interval History:    Presents today status post right shoulder arthroscopy with labral repair with evidence of capsulitis.  She is having significant pain about the glenohumeral joint with any overhead motion.  PMH/PSH/Family History/Social History/Meds/Allergies:    Past Medical History:  Diagnosis Date  . ADHD (attention deficit hyperactivity disorder)   . Arthritis    bilateral hands/fingers  . Complication of anesthesia    Woke up extremely cold after anesthesia. Required warm blankets.  . Headache    Migraine  . Hypothyroidism   . Thyroid disease    Past Surgical History:  Procedure Laterality Date  . ceas     c-section  . KNEE ARTHROSCOPY WITH MENISCAL REPAIR Left 06/04/2022   Procedure: LEFT KNEE ARTHROSCOPY WITH MEDIAL MENISCAL REPAIR;  Surgeon: Huel Cote, MD;  Location: [facility] OR;  Service: Orthopedics;  Laterality: Left;  . REDUCTION MAMMAPLASTY Bilateral    age 38  . WISDOM TOOTH EXTRACTION     Social History   Socioeconomic History  . Marital status: Married    Spouse name: Not on file  . Number of children: Not on file  . Years of education: Not on file  . Highest education level: Not on file  Occupational History  . Not on file  Tobacco Use  . Smoking status: Never  . Smokeless tobacco: Never  Substance and Sexual Activity  . Alcohol use: Yes    Comment: occasionally  . Drug use: No  . Sexual activity: Yes  Other Topics Concern  . Not on file  Social History Narrative  . Not on file   Social Determinants of Health   Financial Resource Strain: Not on file  Food Insecurity: Not on file  Transportation Needs: Not on file  Physical Activity: Not on file  Stress: Not on file  Social Connections: Not on file   No family history on file. Allergies  Allergen Reactions  . Food Other (See Comments)     Eggplant-hives/itching/oral swelling  . Ciprofloxacin Itching, Nausea And Vomiting and Rash    Hallucinations/fever   Current Outpatient Medications  Medication Sig Dispense Refill  . meloxicam (MOBIC) 15 MG tablet Take 1 tablet (15 mg total) by mouth daily. 14 tablet 0  . oxyCODONE (ROXICODONE) 5 MG immediate release tablet Take 1 tablet (5 mg total) by mouth every 4 (four) hours as needed for severe pain or breakthrough pain. 5 tablet 0  . ALPRAZolam (XANAX) 0.5 MG tablet Take 0.5 mg by mouth daily as needed for anxiety.    Marland Kitchen aspirin EC 325 MG tablet Take 1 tablet (325 mg total) by mouth daily. 14 tablet 0  . COLLAGEN PO Take 1 tablet by mouth daily in the afternoon. (Gummy)    . levocetirizine (XYZAL) 5 MG tablet Take 5 mg by mouth daily in the afternoon.    Marland Kitchen MAGNESIUM PO Take 1 tablet by mouth in the morning and at bedtime. (Gummy)    . meloxicam (MOBIC) 15 MG tablet Take 1 tablet (15 mg total) by mouth daily. 30 tablet 2  . methocarbamol (ROBAXIN) 500 MG tablet Take 1 tablet (500 mg total) by mouth 4 (four) times daily. 30 tablet 1  . MOUNJARO 2.5 MG/0.5ML Pen Inject 2.5 mg  into the skin every Monday.    . NP THYROID 15 MG tablet Take 15 mg by mouth See admin instructions. 15 mg + 60 mg=75 mg (take 75 mg dose by mouth every other day--alternating with 90 mg dosage)    . NP THYROID 90 MG tablet Take 90 mg by mouth every other day. (Take 90 mg dose by mouth every other day--alternating with 75 mg dosage)    . oxyCODONE (ROXICODONE) 5 MG immediate release tablet Take 1 tablet (5 mg total) by mouth every 4 (four) hours as needed for severe pain or breakthrough pain. 5 tablet 0  . oxyCODONE (ROXICODONE) 5 MG immediate release tablet Take 1 tablet (5 mg total) by mouth every 4 (four) hours as needed for severe pain. 5 tablet 0  . thyroid (ARMOUR) 60 MG tablet Take 60 mg by mouth See admin instructions. 60 mg + 15 mg=75 mg (take 75 mg dose by mouth every other day--alternating with 90 mg dosage)     . Triamcinolone Acetonide (NASACORT AQ NA) Place 1 spray into the nose every evening.     No current facility-administered medications for this visit.   No results found.  Review of Systems:   A ROS was performed including pertinent positives and negatives as documented in the HPI.   Musculoskeletal Exam:    There were no vitals taken for this visit.  Right shoulder incisions are well-appearing without erythema or drainage.  He is transition she can forward elevate to 90 degrees of external rotation at side to 30 degrees internal rotation deferred today intact distal neurosensory exam  Imaging:      I personally reviewed and interpreted the radiographs.   Assessment:   Status post right shoulder labral repair unfortunately with evidence of adhesive capsulitis.  I did describe that this is unfortunately high risk due to her history of hypothyroidism.  Today's visit I have recommended an ultrasound-guided glenohumeral injection.  I will plan to see her back in 2 weeks for reassessment and we will consider another injection at that time Plan :    -right shoulder ultrasound-guided injection provided after verbal consent obtained     Procedure Note  Patient: Crystal Parks             Date of Birth: 1975/03/15           MRN: [id number]             Visit Date: 11/09/2022  Procedures: Visit Diagnoses: No diagnosis found.  Large Joint Inj: R hip joint on 11/09/2022 12:27 PM Indications: pain Details: 22 G 3.5 in needle, ultrasound-guided anterolateral approach  Arthrogram: No  Medications: 4 mL lidocaine 1 %; 80 mg triamcinolone acetonide 40 MG/ML Outcome: tolerated well, no immediate complications Procedure, treatment alternatives, risks and benefits explained, specific risks discussed. Consent was given by the patient. Immediately prior to procedure a time out was called to verify the correct patient, procedure, equipment, support staff and site/side marked as required.  Patient was prepped and draped in the usual sterile fashion.        I personally saw and evaluated the patient, and participated in the management and treatment plan.  Huel Cote, MD Attending Physician, Orthopedic Surgery  This document was dictated using Dragon voice recognition software. A reasonable attempt at proof reading has been made to minimize errors.

## 2022-11-12 ENCOUNTER — Encounter (HOSPITAL_BASED_OUTPATIENT_CLINIC_OR_DEPARTMENT_OTHER): Payer: Self-pay | Admitting: Orthopaedic Surgery

## 2022-11-12 ENCOUNTER — Ambulatory Visit (HOSPITAL_BASED_OUTPATIENT_CLINIC_OR_DEPARTMENT_OTHER): Payer: 59 | Admitting: Orthopaedic Surgery

## 2022-11-18 ENCOUNTER — Encounter (HOSPITAL_BASED_OUTPATIENT_CLINIC_OR_DEPARTMENT_OTHER): Payer: Self-pay | Admitting: Orthopaedic Surgery

## 2022-11-19 ENCOUNTER — Other Ambulatory Visit (HOSPITAL_BASED_OUTPATIENT_CLINIC_OR_DEPARTMENT_OTHER): Payer: Self-pay | Admitting: Orthopaedic Surgery

## 2022-11-19 ENCOUNTER — Encounter (HOSPITAL_BASED_OUTPATIENT_CLINIC_OR_DEPARTMENT_OTHER): Payer: Self-pay | Admitting: Physical Therapy

## 2022-11-19 ENCOUNTER — Ambulatory Visit (HOSPITAL_BASED_OUTPATIENT_CLINIC_OR_DEPARTMENT_OTHER): Payer: 59 | Admitting: Physical Therapy

## 2022-11-19 DIAGNOSIS — M25511 Pain in right shoulder: Secondary | ICD-10-CM | POA: Diagnosis not present

## 2022-11-19 DIAGNOSIS — R262 Difficulty in walking, not elsewhere classified: Secondary | ICD-10-CM

## 2022-11-19 DIAGNOSIS — M25611 Stiffness of right shoulder, not elsewhere classified: Secondary | ICD-10-CM

## 2022-11-19 DIAGNOSIS — M6281 Muscle weakness (generalized): Secondary | ICD-10-CM

## 2022-11-19 MED ORDER — METHOCARBAMOL 500 MG PO TABS: 500.0000 mg | ORAL_TABLET | Freq: Four times a day (QID) | ORAL | 3 refills | Status: AC

## 2022-11-19 NOTE — Therapy (Signed)
OUTPATIENT PHYSICAL THERAPY SHOULDER TREATMENT   Patient Name: Crystal Parks MRN: [id number] DOB:[birth year], 47 y.o., female Today's Date: 11/19/2022  END OF SESSION:  PT End of Session - 11/19/22 1332     Visit Number 9    Number of Visits 16    Date for PT Re-Evaluation 12/21/22    Authorization Type [employer]    PT Start Time 1315    PT Stop Time 1358    PT Time Calculation (min) 43 min    Activity Tolerance Patient tolerated treatment well    Behavior During Therapy WFL for tasks assessed/performed                 Past Medical History:  Diagnosis Date   ADHD (attention deficit hyperactivity disorder)    Arthritis    bilateral hands/fingers   Complication of anesthesia    Woke up extremely cold after anesthesia. Required warm blankets.   Headache    Migraine   Hypothyroidism    Thyroid disease    Past Surgical History:  Procedure Laterality Date   ceas     c-section   KNEE ARTHROSCOPY WITH MENISCAL REPAIR Left 06/04/2022   Procedure: LEFT KNEE ARTHROSCOPY WITH MEDIAL MENISCAL REPAIR;  Surgeon: Huel Cote, MD;  Location: [facility] OR;  Service: Orthopedics;  Laterality: Left;   REDUCTION MAMMAPLASTY Bilateral    age 74   WISDOM TOOTH EXTRACTION     Patient Active Problem List   Diagnosis Date Noted   Acute medial meniscus tear of left knee 06/04/2022   Inappropriate sinus tachycardia (HCC) 06/30/2018   Palpitations 06/30/2018   Acute cystitis with hematuria 11/21/2016   Hypothyroidism 11/29/2015   Impaired fasting glucose 11/29/2015   Myalgia 11/29/2015   Female pattern alopecia 11/15/2011   Telogen effluvium 11/15/2011   ALLERGIC RHINITIS 10/18/2009   ASTHMA 10/18/2009   Asthma 10/18/2009    PCP: [facility]   REFERRING PROVIDER: Dr Huel Cote   REFERRING DIAG: RIGHT SHOULDER LABRAL TEAR   THERAPY DIAG:  Right shoulder pain, unspecified chronicity  Stiffness of right shoulder, not elsewhere classified  Muscle weakness  (generalized)  Difficulty in walking, not elsewhere classified  Rationale for Evaluation and Treatment: Rehabilitation  ONSET DATE:   Days since surgery: 09/24/2022 Surgical date    PROCEDURE: 1.  Right shoulder anterior labral repair 2.  Right shoulder limited debridement   SUBJECTIVE:                                                                                                                                                                                      SUBJECTIVE STATEMENT: The patient has had increased pain since her  prior visit.  She reports initial benefit from the trigger point dry needling.  She reports her exercises been causing her increased pain.  She feels significant pain down her posterior shoulder.  She is seen the MD tomorrow.   Eval:Patient has long history of right shoulder instability and pain.  She had a right shoulder labral repair on 09/24/2022.  At this time her pain is well-controlled.  She is currently compliant with sling use.  She did recently have a meniscal repair.  Hand dominance: Right  PERTINENT HISTORY: Left knee meniscal repair   PAIN:  Are you having pain? Yes: NPRS scale: 7/10 at worst  Pain location: right anterior shoulder  Pain description: aching  Aggravating factors: rest  Relieving factors:   PRECAUTIONS: Shoulder  RED FLAGS: None   WEIGHT BEARING RESTRICTIONS: Yes NWB  FALLS:  Has patient fallen in last 6 months? 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   PLOF: Independent   Hobbies:  Martial arts  [employer] training  PATIENT GOALS:   NEXT MD VISIT:   OBJECTIVE:   DIAGNOSTIC FINDINGS:  Nothing post-op  PATIENT SURVEYS:  FOTO    COGNITION: Overall cognitive status: Within functional limits for tasks assessed     SENSATION: Some impaired sesation into her thumb   POSTURE: Good   UPPER EXTREMITY ROM:   Passive ROM Right eval Left eval   Shoulder flexion 80 without resistance    Shoulder extension    Shoulder abduction    Shoulder adduction    Shoulder internal rotation To stomach    Shoulder external rotation 4   Elbow flexion    Elbow extension    Wrist flexion    Wrist extension    Wrist ulnar deviation    Wrist radial deviation    Wrist pronation    Wrist supination    (Blank rows = not tested)  UPPER EXTREMITY MMT:  MMT Right eval Left eval  Shoulder flexion    Shoulder extension    Shoulder abduction    Shoulder adduction    Shoulder internal rotation    Shoulder external rotation    Middle trapezius    Lower trapezius    Elbow flexion    Elbow extension    Wrist flexion    Wrist extension    Wrist ulnar deviation    Wrist radial deviation    Wrist pronation    Wrist supination    Grip strength (lbs)    (Blank rows = not tested)      PALPATION:  No unexpected TTP    TODAY'S TREATMENT:                                                                                                                                         DATE:  10/10  Manual: trigger point release to upper trap/ deltoid/  Posterior shoulder; inferior and posterior glides grade 2 and 3 to reduce pain and improve movement.  Pec release.  Slow rhythmic ER IR in pain-free ranges.     10/7 Trigger Point Dry-Needling  Treatment instructions: Expect mild to moderate muscle soreness. S/S of pneumothorax if dry needled over a lung field, and to seek immediate medical attention should they occur. Patient verbalized understanding of these instructions and education.  Patient Consent Given: Yes Education handout provided: Yes Muscles treated: sub-scap 2x with a .30x50 needle teres minor, 1x with .30x50 needle upper trap 2x with a .30x50 needle  Electrical stimulation performed: No Parameters: N/A Treatment response/outcome: great twitch   Wand er 3x 5 5 sec hold  Wand flexion 3x 5 sec hols       9/30  STM R UT,  deltoid, infra  PROM to tolerance 4-8 wks protocol limits( flexion 160, ABD 160, ER to 45)   Cane ABD 2x10 Pec stretch attempted but too painful Self STM for rear delt   9/23  STM R UT, pec, deltoid PROM to protocol limits  Self STM and pain management options  9/16  Manual: PROM into protocol limits  Trigger point release to upper trap and posterior shoulder; Grade II and III PA and AP mobilization   Wand flex in pain free range 2x15 spent time reviewing pain free ranges and how to strengthen.  Prone row 2x12 Prone extension 2x10    Supine ER 3x10    Given for HEP   9/11 Manual: PROM into protocol limits  Trigger point release to upper trap and posterior shoulder   Wand press 2x15  Wand flex in pain free range 2x15   ER iso  x15   9/5 Manual: PROM into protocol limits   Self flexion x10 in sitting  Self ER x10 in sitting supported   P[place]ley 2 min with min cuing.   Scap retraction 2x20  Sling isometrics 10x 5 sec hold ER/IR Flexion      Eval:Access Code: [email or web address] URL: [email or web address] Date: 09/27/2022 Prepared by: Lorayne Bender  Exercises - Tip Pinch with Putty  - 1 x daily - 7 x weekly - 3 sets - 10 reps - Key Pinch with Putty  - 1 x daily - 7 x weekly - 3 sets - 10 reps - Thumb MCP and IP Flexion with Putty  - 1 x daily - 7 x weekly - 3 sets - 10 reps - Circular Shoulder Pendulum with Table Support  - 1 x daily - 7 x weekly - 3 sets - 10 reps - Standing Single Arm Elbow Flexion with Resistance  - 1 x daily - 7 x weekly - 3 sets - 10 reps  Manual: PROM into all planes per protocol    PATIENT EDUCATION: Education details: HEP, symptom management,  Person educated: Patient Education method: Explanation, Demonstration, Tactile cues, Verbal cues, and Handouts Education comprehension: verbalized understanding, returned demonstration, verbal cues required, tactile cues required, and needs further education  HOME EXERCISE  PROGRAM: Access Code: [email or web address] URL: [email or web address] Date: 09/27/2022 Prepared by: Lorayne Bender  Exercises - Tip Pinch with Putty  - 1 x daily - 7 x weekly - 3 sets - 10 reps - Key Pinch with Putty  - 1 x daily - 7 x weekly - 3 sets - 10 reps - Thumb MCP and IP Flexion with Putty  - 1 x daily - 7 x weekly - 3 sets - 10 reps - Circular Shoulder Pendulum with Table Support  -  1 x daily - 7 x weekly - 3 sets - 10 reps - Standing Single Arm Elbow Flexion with Resistance  - 1 x daily - 7 x weekly - 3 sets - 10 reps  ASSESSMENT:  CLINICAL IMPRESSION: Patient was advised to hold off her exercises until she sees the MD.  Her ER was tight upon arrival but had a significant improvement with light movement and stretching.  She continues to have significant spasming of her upper trap extending into her deltoid and bicep.  She is very guarded with movement.  She is advised to movement and ranges that does not cause increased pain to reduce guarding.  She would benefit from further trigger point dry needling but held today secondary to patient having likely injection tomorrow.  She may go to an independent needle her prior to her work trip.  We will see her when she goes back from her work trip.  She was advised if the shot is beneficial to begin light exercises when advised by MD.   Rhys Martini: Patient is a 47 year old female status post right labral repair on 09/24/2022.  At this time her pain is well-controlled.  She presents with expected limitations in range of motion, strength, and functional mobility.  She is very active in martial arts prior to her surgery.  She would like to return to those activities.  She would also like to begin resistance training.  She would benefit from skilled therapy to improve functional mobility of dominant arm. OBJECTIVE IMPAIRMENTS: decreased knowledge of condition, decreased ROM, decreased strength, increased muscle spasms, impaired UE functional use, and  pain.   ACTIVITY LIMITATIONS: carrying, lifting, sleeping, bathing, dressing, self feeding, and reach over head  PARTICIPATION LIMITATIONS: meal prep, cleaning, laundry, driving, shopping, community activity, occupation, and yard work  PERSONAL FACTORS: 1-2 comorbidities: recent knee surgery   are also affecting patient's functional outcome.   REHAB POTENTIAL: Excellent  CLINICAL DECISION MAKING: Stable/uncomplicated  EVALUATION COMPLEXITY: Low   GOALS: Goals reviewed with patient? Yes  SHORT TERM GOALS: Target date: 11/08/2022    Patient will increase passive right shoulder flexion to 90 degrees Baseline: Goal status: INITIAL  2.  Patient will increase passive shoulder external rotation to 30 degrees right Baseline:  Goal status: INITIAL  3.  Patient will wean out of sling when directed by MD Baseline:  Goal status: INITIAL  4.  Patient will be independent with base exercise program Baseline:  Goal status: INITIAL  LONG TERM GOALS: Target date: 12/21/2022      Patient will reach overhead with 2 pound weight without increased pain in order to perform ADLs Baseline:  Goal status: INITIAL  2.  Patient will reach behind her back without pain in order to perform ADLs Baseline:  Goal status: INITIAL  3.  Patient will reach behind her head without pain in order to perform ADLs Baseline:  Goal status: INITIAL  4.  Patient will return to light gym program when cleared by MD Baseline:  Goal status: INITIAL PLAN:  PT FREQUENCY: 2x/week  PT DURATION: 12 weeks  PLANNED INTERVENTIONS:  Therapeutic exercises, Therapeutic activity, Neuromuscular re-education, Balance training, Gait training, Patient/Family education, Self Care, Joint mobilization, Stair training, DME instructions, Aquatic Therapy, Dry Needling, Electrical stimulation, Cryotherapy, Moist heat, Taping, Manual therapy, and Re-evaluation.   PLAN FOR NEXT SESSION: begin per shoulder instability protocol     Dessie Coma, PT 11/19/2022, 1:49 PM

## 2022-11-20 ENCOUNTER — Encounter (HOSPITAL_BASED_OUTPATIENT_CLINIC_OR_DEPARTMENT_OTHER): Payer: Self-pay | Admitting: Physical Therapy

## 2022-11-21 ENCOUNTER — Ambulatory Visit (HOSPITAL_BASED_OUTPATIENT_CLINIC_OR_DEPARTMENT_OTHER): Payer: 59 | Admitting: Orthopaedic Surgery

## 2022-11-21 DIAGNOSIS — M7501 Adhesive capsulitis of right shoulder: Secondary | ICD-10-CM

## 2022-11-21 MED ORDER — TRIAMCINOLONE ACETONIDE 40 MG/ML IJ SUSP
80.0000 mg | INTRAMUSCULAR | Status: AC | PRN
  Administered 2022-11-21: 80 mg via INTRA_ARTICULAR

## 2022-11-21 MED ORDER — LIDOCAINE HCL 1 % IJ SOLN
4.0000 mL | INTRAMUSCULAR | Status: AC | PRN
Start: 2022-11-21 — End: 2022-11-21
  Administered 2022-11-21: 4 mL

## 2022-11-21 NOTE — Progress Notes (Signed)
Post Operative Evaluation    Procedure/Date of Surgery: Status post right shoulder labral repair 8/26  Interval History:    Presents today status post right shoulder arthroscopy with labral repair with evidence of capsulitis.  At this time she did improve from her last injection.  She did get significant relief from this.  Today's visit we did discuss a second injection.  PMH/PSH/Family History/Social History/Meds/Allergies:    Past Medical History:  Diagnosis Date  . ADHD (attention deficit hyperactivity disorder)   . Arthritis    bilateral hands/fingers  . Complication of anesthesia    Woke up extremely cold after anesthesia. Required warm blankets.  . Headache    Migraine  . Hypothyroidism   . Thyroid disease    Past Surgical History:  Procedure Laterality Date  . ceas     c-section  . KNEE ARTHROSCOPY WITH MENISCAL REPAIR Left 06/04/2022   Procedure: LEFT KNEE ARTHROSCOPY WITH MEDIAL MENISCAL REPAIR;  Surgeon: Huel Cote, MD;  Location: [facility] OR;  Service: Orthopedics;  Laterality: Left;  . REDUCTION MAMMAPLASTY Bilateral    age 8  . WISDOM TOOTH EXTRACTION     Social History   Socioeconomic History  . Marital status: Married    Spouse name: Not on file  . Number of children: Not on file  . Years of education: Not on file  . Highest education level: Not on file  Occupational History  . Not on file  Tobacco Use  . Smoking status: Never  . Smokeless tobacco: Never  Substance and Sexual Activity  . Alcohol use: Yes    Comment: occasionally  . Drug use: No  . Sexual activity: Yes  Other Topics Concern  . Not on file  Social History Narrative  . Not on file   Social Determinants of Health   Financial Resource Strain: Not on file  Food Insecurity: Not on file  Transportation Needs: Not on file  Physical Activity: Not on file  Stress: Not on file  Social Connections: Not on file   No family history on  file. Allergies  Allergen Reactions  . Food Other (See Comments)    Eggplant-hives/itching/oral swelling  . Ciprofloxacin Itching, Nausea And Vomiting and Rash    Hallucinations/fever   Current Outpatient Medications  Medication Sig Dispense Refill  . ALPRAZolam (XANAX) 0.5 MG tablet Take 0.5 mg by mouth daily as needed for anxiety.    Marland Kitchen aspirin EC 325 MG tablet Take 1 tablet (325 mg total) by mouth daily. 14 tablet 0  . COLLAGEN PO Take 1 tablet by mouth daily in the afternoon. (Gummy)    . levocetirizine (XYZAL) 5 MG tablet Take 5 mg by mouth daily in the afternoon.    Marland Kitchen MAGNESIUM PO Take 1 tablet by mouth in the morning and at bedtime. (Gummy)    . meloxicam (MOBIC) 15 MG tablet Take 1 tablet (15 mg total) by mouth daily. 30 tablet 2  . meloxicam (MOBIC) 15 MG tablet Take 1 tablet (15 mg total) by mouth daily. 14 tablet 0  . methocarbamol (ROBAXIN) 500 MG tablet Take 1 tablet (500 mg total) by mouth 4 (four) times daily. 30 tablet 3  . MOUNJARO 2.5 MG/0.5ML Pen Inject 2.5 mg into the skin every Monday.    . NP THYROID 15 MG tablet Take 15 mg by  mouth See admin instructions. 15 mg + 60 mg=75 mg (take 75 mg dose by mouth every other day--alternating with 90 mg dosage)    . NP THYROID 90 MG tablet Take 90 mg by mouth every other day. (Take 90 mg dose by mouth every other day--alternating with 75 mg dosage)    . oxyCODONE (ROXICODONE) 5 MG immediate release tablet Take 1 tablet (5 mg total) by mouth every 4 (four) hours as needed for severe pain or breakthrough pain. 5 tablet 0  . oxyCODONE (ROXICODONE) 5 MG immediate release tablet Take 1 tablet (5 mg total) by mouth every 4 (four) hours as needed for severe pain. 5 tablet 0  . oxyCODONE (ROXICODONE) 5 MG immediate release tablet Take 1 tablet (5 mg total) by mouth every 4 (four) hours as needed for severe pain or breakthrough pain. 5 tablet 0  . thyroid (ARMOUR) 60 MG tablet Take 60 mg by mouth See admin instructions. 60 mg + 15 mg=75 mg  (take 75 mg dose by mouth every other day--alternating with 90 mg dosage)    . Triamcinolone Acetonide (NASACORT AQ NA) Place 1 spray into the nose every evening.     No current facility-administered medications for this visit.   No results found.  Review of Systems:   A ROS was performed including pertinent positives and negatives as documented in the HPI.   Musculoskeletal Exam:    There were no vitals taken for this visit.  Right shoulder incisions are well-appearing without erythema or drainage.  He is transition she can forward elevate to 90 degrees of external rotation at side to 30 degrees internal rotation deferred today intact distal neurosensory exam  Imaging:      I personally reviewed and interpreted the radiographs.   Assessment:   Status post right shoulder labral repair unfortunately with evidence of adhesive capsulitis.  I did describe that this is unfortunately high risk due to her history of hypothyroidism.  Today's visit I have recommended an ultrasound-guided glenohumeral injection.  I will plan to see her back in 2 weeks for reassessment and we will consider another injection at that time Plan :    -right shoulder ultrasound-guided injection provided after verbal consent obtained     Procedure Note  Patient: Crystal Parks             Date of Birth: December 21, 1975           MRN: [id number]             Visit Date: 11/21/2022  Procedures: Visit Diagnoses: No diagnosis found.  Large Joint Inj: R glenohumeral on 11/21/2022 12:15 PM Indications: pain Details: 22 G 1.5 in needle, ultrasound-guided anterior approach  Arthrogram: No  Medications: 4 mL lidocaine 1 %; 80 mg triamcinolone acetonide 40 MG/ML Outcome: tolerated well, no immediate complications Procedure, treatment alternatives, risks and benefits explained, specific risks discussed. Consent was given by the patient. Immediately prior to procedure a time out was called to verify the correct  patient, procedure, equipment, support staff and site/side marked as required. Patient was prepped and draped in the usual sterile fashion.       I personally saw and evaluated the patient, and participated in the management and treatment plan.  Huel Cote, MD Attending Physician, Orthopedic Surgery  This document was dictated using Dragon voice recognition software. A reasonable attempt at proof reading has been made to minimize errors.

## 2022-11-26 ENCOUNTER — Encounter (HOSPITAL_BASED_OUTPATIENT_CLINIC_OR_DEPARTMENT_OTHER): Payer: Self-pay | Admitting: Physical Therapy

## 2022-11-26 ENCOUNTER — Ambulatory Visit (HOSPITAL_BASED_OUTPATIENT_CLINIC_OR_DEPARTMENT_OTHER): Payer: 59 | Admitting: Physical Therapy

## 2022-11-26 DIAGNOSIS — M25511 Pain in right shoulder: Secondary | ICD-10-CM | POA: Diagnosis not present

## 2022-11-26 DIAGNOSIS — M25611 Stiffness of right shoulder, not elsewhere classified: Secondary | ICD-10-CM

## 2022-11-26 DIAGNOSIS — M6281 Muscle weakness (generalized): Secondary | ICD-10-CM

## 2022-11-26 NOTE — Therapy (Signed)
OUTPATIENT PHYSICAL THERAPY SHOULDER TREATMENT   Patient Name: Crystal Parks MRN: [id number] DOB:December 26, 1975, 47 y.o., female Today's Date: 11/26/2022  END OF SESSION:  PT End of Session - 11/26/22 1531     Visit Number 10    Number of Visits 16    Date for PT Re-Evaluation 12/21/22    Authorization Type [employer]    PT Start Time 1445    PT Stop Time 1528    PT Time Calculation (min) 43 min    Activity Tolerance Patient tolerated treatment well    Behavior During Therapy WFL for tasks assessed/performed                  Past Medical History:  Diagnosis Date   ADHD (attention deficit hyperactivity disorder)    Arthritis    bilateral hands/fingers   Complication of anesthesia    Woke up extremely cold after anesthesia. Required warm blankets.   Headache    Migraine   Hypothyroidism    Thyroid disease    Past Surgical History:  Procedure Laterality Date   ceas     c-section   KNEE ARTHROSCOPY WITH MENISCAL REPAIR Left 06/04/2022   Procedure: LEFT KNEE ARTHROSCOPY WITH MEDIAL MENISCAL REPAIR;  Surgeon: Huel Cote, MD;  Location: [facility] OR;  Service: Orthopedics;  Laterality: Left;   REDUCTION MAMMAPLASTY Bilateral    age 77   WISDOM TOOTH EXTRACTION     Patient Active Problem List   Diagnosis Date Noted   Acute medial meniscus tear of left knee 06/04/2022   Inappropriate sinus tachycardia (HCC) 06/30/2018   Palpitations 06/30/2018   Acute cystitis with hematuria 11/21/2016   Hypothyroidism 11/29/2015   Impaired fasting glucose 11/29/2015   Myalgia 11/29/2015   Female pattern alopecia 11/15/2011   Telogen effluvium 11/15/2011   ALLERGIC RHINITIS 10/18/2009   ASTHMA 10/18/2009   Asthma 10/18/2009    [facility]P: [facility]   REFERRING PROVIDER: Dr Huel Cote   REFERRING DIAG: RIGHT SHOULDER LABRAL TEAR   THERAPY DIAG:  Right shoulder pain, unspecified chronicity  Stiffness of right shoulder, not elsewhere classified  Muscle weakness  (generalized)  Rationale for Evaluation and Treatment: Rehabilitation  ONSET DATE:   Days since surgery: 09/24/2022 Surgical date    PROCEDURE: 1.  Right shoulder anterior labral repair 2.  Right shoulder limited debridement   SUBJECTIVE:                                                                                                                                                                                      SUBJECTIVE STATEMENT: Pt states the pain has come down a low grade but constant ache now.  Pt just had injection on 10/25. Pt states that ER reaching is extremely stiff and uncomfortable. Pt cannot reach into IR on belly.   Eval:Patient has long history of right shoulder instability and pain.  She had a right shoulder labral repair on 09/24/2022.  At this time her pain is well-controlled.  She is currently compliant with sling use.  She did recently have a meniscal repair.  Hand dominance: Right  PERTINENT HISTORY: Left knee meniscal repair   PAIN:  Are you having pain? Yes: NPRS scale: 2/10 at worst  Pain location: right anterior shoulder  Pain description: constant, mild ache  Aggravating factors: rest  Relieving factors:   PRECAUTIONS: Shoulder  RED FLAGS: None   WEIGHT BEARING RESTRICTIONS: Yes NWB  FALLS:  Has patient fallen in last 6 months? 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   PLOF: Independent   Hobbies:  Martial arts  [employer] training  PATIENT GOALS:   NEXT MD VISIT:   OBJECTIVE:   DIAGNOSTIC FINDINGS:  Nothing post-op  PATIENT SURVEYS:  FOTO    COGNITION: Overall cognitive status: Within functional limits for tasks assessed     SENSATION: Some impaired sesation into her thumb   POSTURE: Good   UPPER EXTREMITY ROM:   Passive ROM Right eval R  10/28 Left eval  Shoulder flexion 80 without resistance  AAROM   110   Shoulder extension  30   Shoulder abduction      Shoulder adduction  AAROM 100   Shoulder internal rotation To stomach  60 deg    Shoulder external rotation 4 8 AAROM at 0 deg    Elbow flexion     Elbow extension     Wrist flexion     Wrist extension     Wrist ulnar deviation     Wrist radial deviation     Wrist pronation     Wrist supination     (Blank rows = not tested)  UPPER EXTREMITY [id number] to test      PALPATION:  TTP of deltoid, levator, UT, infra   TODAY'S TREATMENT:                                                                                                                                         DATE:  10/28  Exercises - Standing Single Arm Elbow Flexion with Resistance  - 1 x daily - 7 x weekly - 3 sets - 10 reps - Prone Shoulder Row  - 1 x daily - 7 x weekly - 3 sets - 10 reps - Doorway Pec Stretch at 60 Degrees Abduction with Arm Straight  - 2 x daily - 7 x weekly - 1 sets - 3 reps - 30 hold - Seated Shoulder Flexion Towel Slide at Table Top  - 1 x daily - 7 x weekly - 1  sets - 1 reps - 5 min hold - Seated Shoulder Abduction Towel Slide at Table Top  - 1 x daily - 7 x weekly - 1 sets - 1 reps - 5 min hold - Seated Shoulder Abduction AAROM with Pulley Behind  - 1 x daily - 7 x weekly - 1 sets - 1 reps - 5 min hold - Seated Shoulder Flexion AAROM with Pulley Behind  - 1 x daily - 7 x weekly - 1 sets - 1 reps - 5 min hold - Standing Shoulder Shrug and Retraction with Resistance  - 1 x daily - 7 x weekly - 2 sets - 10 reps - 2 hold - Seated Shoulder External Rotation AAROM with Cane and Hand in Neutral  - 1 x daily - 7 x weekly - 1 sets - 1 reps - 5 min hold    10/10  Manual: trigger point release to upper trap/ deltoid/ Posterior shoulder; inferior and posterior glides grade 2 and 3 to reduce pain and improve movement.  Pec release.  Slow rhythmic ER IR in pain-free ranges.  Treatment instructions: Expect mild to moderate muscle soreness. S/S of pneumothorax if dry needled over a lung field, and to seek  immediate medical attention should they occur. Patient verbalized understanding of these instructions and education.  Patient Consent Given: Yes Education handout provided: Yes Muscles treated: 2x in anterior and medial deltoid 1x with .30x50 needle upper trap 2x with a .30x50 needle  Electrical stimulation performed: No Parameters: N/A Treatment response/outcome: great twitch   10/7 Trigger Point Dry-Needling  Treatment instructions: Expect mild to moderate muscle soreness. S/S of pneumothorax if dry needled over a lung field, and to seek immediate medical attention should they occur. Patient verbalized understanding of these instructions and education.  Patient Consent Given: Yes Education handout provided: Yes Muscles treated: sub-scap 2x with a .30x50 needle teres minor, 1x with .30x50 needle upper trap 2x with a .30x50 needle  Electrical stimulation performed: No Parameters: N/A Treatment response/outcome: great twitch   Wand er 3x 5 5 sec hold  Wand flexion 3x 5 sec hols       9/30  STM R UT, deltoid, infra  PROM to tolerance 4-8 wks protocol limits( flexion 160, ABD 160, ER to 45)   Cane ABD 2x10 Pec stretch attempted but too painful Self STM for rear delt   9/23  STM R UT, pec, deltoid PROM to protocol limits  Self STM and pain management options  9/16  Manual: PROM into protocol limits  Trigger point release to upper trap and posterior shoulder; Grade II and III PA and AP mobilization   Wand flex in pain free range 2x15 spent time reviewing pain free ranges and how to strengthen.  Prone row 2x12 Prone extension 2x10    Supine ER 3x10    Given for HEP   9/11 Manual: PROM into protocol limits  Trigger point release to upper trap and posterior shoulder   Wand press 2x15  Wand flex in pain free range 2x15   ER iso  x15   9/5 Manual: PROM into protocol limits   Self flexion x10 in sitting  Self ER x10 in sitting supported   Pulley 2 min  with min cuing.   Scap retraction 2x20  Sling isometrics 10x 5 sec hold ER/IR Flexion      Eval:Access Code: [email or web address] URL: [email or web address] Date: 09/27/2022 Prepared by: Lorayne Bender  Exercises - Tip Pinch with Putty  - 1 x daily -  7 x weekly - 3 sets - 10 reps - Key Pinch with Putty  - 1 x daily - 7 x weekly - 3 sets - 10 reps - Thumb MCP and IP Flexion with Putty  - 1 x daily - 7 x weekly - 3 sets - 10 reps - Circular Shoulder Pendulum with Table Support  - 1 x daily - 7 x weekly - 3 sets - 10 reps - Standing Single Arm Elbow Flexion with Resistance  - 1 x daily - 7 x weekly - 3 sets - 10 reps  Manual: PROM into all planes per protocol    PATIENT EDUCATION: Education details: HEP, symptom management,  Person educated: Patient Education method: Explanation, Demonstration, Tactile cues, Verbal cues, and Handouts Education comprehension: verbalized understanding, returned demonstration, verbal cues required, tactile cues required, and needs further education  HOME EXERCISE PROGRAM: Access Code: [email or web address] URL: [email or web address] Date: 09/27/2022 Prepared by: Lorayne Bender  Exercises - Tip Pinch with Putty  - 1 x daily - 7 x weekly - 3 sets - 10 reps - Key Pinch with Putty  - 1 x daily - 7 x weekly - 3 sets - 10 reps - Thumb MCP and IP Flexion with Putty  - 1 x daily - 7 x weekly - 3 sets - 10 reps - Circular Shoulder Pendulum with Table Support  - 1 x daily - 7 x weekly - 3 sets - 10 reps - Standing Single Arm Elbow Flexion with Resistance  - 1 x daily - 7 x weekly - 3 sets - 10 reps  ASSESSMENT:  CLINICAL IMPRESSION:  Patient has evidence of adhesive capsulitis and pain be limiting factor for progression of right shoulder strength and range of motion postoperatively.  Patient still highly limited with overhead reaching activity and a form of rotational reaching.  Patient has had 2 cortisone injections at this point with significantly  improved pain down to baseline aching.  However patient is still significantly stiff into capsular pattern of the shoulder.  Patient does improve range of motion at this time with long duration holds stretches the patient advised to use creep principle as well as heat at home in order to progress stretching.  Home exercise program updated today.  Patient advised to continue with focusing on range of motion as her frozen shoulder pain appears to be better managed at this time.  Patient benefit from continued skilled therapy in order to address functional deficits for return to prior level function and normal ADL.  FOTO at next as time allows.  Eval: Patient is a 47 year old female status post right labral repair on 09/24/2022.  At this time her pain is well-controlled.  She presents with expected limitations in range of motion, strength, and functional mobility.  She is very active in martial arts prior to her surgery.  She would like to return to those activities.  She would also like to begin resistance training.  She would benefit from skilled therapy to improve functional mobility of dominant arm. OBJECTIVE IMPAIRMENTS: decreased knowledge of condition, decreased ROM, decreased strength, increased muscle spasms, impaired UE functional use, and pain.   ACTIVITY LIMITATIONS: carrying, lifting, sleeping, bathing, dressing, self feeding, and reach over head  PARTICIPATION LIMITATIONS: meal prep, cleaning, laundry, driving, shopping, community activity, occupation, and yard work  PERSONAL FACTORS: 1-2 comorbidities: recent knee surgery   are also affecting patient's functional outcome.   REHAB POTENTIAL: Excellent  CLINICAL DECISION MAKING: Stable/uncomplicated  EVALUATION COMPLEXITY: Low   GOALS: Goals  reviewed with patient? Yes  SHORT TERM GOALS: Target date: 11/08/2022    Patient will increase passive right shoulder flexion to 90 degrees Baseline: Goal status: MET  2.  Patient will  increase passive shoulder external rotation to 30 degrees right Baseline:  Goal status: ongoing  3.  Patient will wean out of sling when directed by MD Baseline:  Goal status: met  4.  Patient will be independent with base exercise program Baseline:  Goal status: met  LONG TERM GOALS: Target date: 12/21/2022      Patient will reach overhead with 2 pound weight without increased pain in order to perform ADLs Baseline:  Goal status: ongoing  2.  Patient will reach behind her back without pain in order to perform ADLs Baseline:  Goal status: ongoing  3.  Patient will reach behind her head without pain in order to perform ADLs Baseline:  Goal status: ongoing  4.  Patient will return to light gym program when cleared by MD Baseline:  Goal status: ongoing PLAN:  PT FREQUENCY: 2x/week  PT DURATION: 12 weeks  PLANNED INTERVENTIONS:  Therapeutic exercises, Therapeutic activity, Neuromuscular re-education, Balance training, Gait training, Patient/Family education, Self Care, Joint mobilization, Stair training, DME instructions, Aquatic Therapy, Dry Needling, Electrical stimulation, Cryotherapy, Moist heat, Taping, Manual therapy, and Re-evaluation.   PLAN FOR NEXT SESSION: begin per shoulder instability protocol    Zebedee Iba, PT 11/26/2022, 3:35 PM

## 2022-12-03 ENCOUNTER — Ambulatory Visit (HOSPITAL_BASED_OUTPATIENT_CLINIC_OR_DEPARTMENT_OTHER): Payer: 59 | Admitting: Orthopaedic Surgery

## 2022-12-03 ENCOUNTER — Encounter (HOSPITAL_BASED_OUTPATIENT_CLINIC_OR_DEPARTMENT_OTHER): Payer: Self-pay | Admitting: Orthopaedic Surgery

## 2022-12-03 ENCOUNTER — Encounter (HOSPITAL_BASED_OUTPATIENT_CLINIC_OR_DEPARTMENT_OTHER): Payer: Self-pay | Admitting: Physical Therapy

## 2022-12-03 ENCOUNTER — Ambulatory Visit (HOSPITAL_BASED_OUTPATIENT_CLINIC_OR_DEPARTMENT_OTHER): Payer: 59 | Attending: Orthopaedic Surgery | Admitting: Physical Therapy

## 2022-12-03 DIAGNOSIS — M25562 Pain in left knee: Secondary | ICD-10-CM | POA: Diagnosis present

## 2022-12-03 DIAGNOSIS — R262 Difficulty in walking, not elsewhere classified: Secondary | ICD-10-CM | POA: Diagnosis present

## 2022-12-03 DIAGNOSIS — M25611 Stiffness of right shoulder, not elsewhere classified: Secondary | ICD-10-CM | POA: Diagnosis present

## 2022-12-03 DIAGNOSIS — M6281 Muscle weakness (generalized): Secondary | ICD-10-CM | POA: Insufficient documentation

## 2022-12-03 DIAGNOSIS — M7501 Adhesive capsulitis of right shoulder: Secondary | ICD-10-CM

## 2022-12-03 DIAGNOSIS — M25511 Pain in right shoulder: Secondary | ICD-10-CM | POA: Diagnosis present

## 2022-12-03 NOTE — Progress Notes (Signed)
Post Operative Evaluation    Procedure/Date of Surgery: Status post right shoulder labral repair 8/26  Interval History:    Presents today status post right shoulder arthroscopy with labral repair with evidence of capsulitis.  She is continuing to make improvements through mid range of motion although is still lacking external rotation and overhead flexion.  Her pain overall is at a more controlled level at this time.  PMH/PSH/Family History/Social History/Meds/Allergies:    Past Medical History:  Diagnosis Date   ADHD (attention deficit hyperactivity disorder)    Arthritis    bilateral hands/fingers   Complication of anesthesia    Woke up extremely cold after anesthesia. Required warm blankets.   Headache    Migraine   Hypothyroidism    Thyroid disease    Past Surgical History:  Procedure Laterality Date   ceas     c-section   KNEE ARTHROSCOPY WITH MENISCAL REPAIR Left 06/04/2022   Procedure: LEFT KNEE ARTHROSCOPY WITH MEDIAL MENISCAL REPAIR;  Surgeon: Huel Cote, MD;  Location: [facility] OR;  Service: Orthopedics;  Laterality: Left;   REDUCTION MAMMAPLASTY Bilateral    age 47   WISDOM TOOTH EXTRACTION     Social History   Socioeconomic History   Marital status: Married    Spouse name: Not on file   Number of children: Not on file   Years of education: Not on file   Highest education level: Not on file  Occupational History   Not on file  Tobacco Use   Smoking status: Never   Smokeless tobacco: Never  Substance and Sexual Activity   Alcohol use: Yes    Comment: occasionally   Drug use: No   Sexual activity: Yes  Other Topics Concern   Not on file  Social History Narrative   Not on file   Social Determinants of Health   Financial Resource Strain: Not on file  Food Insecurity: Not on file  Transportation Needs: Not on file  Physical Activity: Not on file  Stress: Not on file  Social Connections: Not on file   History  reviewed. No pertinent family history. Allergies  Allergen Reactions   Food Other (See Comments)    Eggplant-hives/itching/oral swelling   Ciprofloxacin Itching, Nausea And Vomiting and Rash    Hallucinations/fever   Current Outpatient Medications  Medication Sig Dispense Refill   ALPRAZolam (XANAX) 0.5 MG tablet Take 0.5 mg by mouth daily as needed for anxiety.     aspirin EC 325 MG tablet Take 1 tablet (325 mg total) by mouth daily. 14 tablet 0   COLLAGEN PO Take 1 tablet by mouth daily in the afternoon. (Gummy)     levocetirizine (XYZAL) 5 MG tablet Take 5 mg by mouth daily in the afternoon.     MAGNESIUM PO Take 1 tablet by mouth in the morning and at bedtime. (Gummy)     meloxicam (MOBIC) 15 MG tablet Take 1 tablet (15 mg total) by mouth daily. 30 tablet 2   meloxicam (MOBIC) 15 MG tablet Take 1 tablet (15 mg total) by mouth daily. 14 tablet 0   methocarbamol (ROBAXIN) 500 MG tablet Take 1 tablet (500 mg total) by mouth 4 (four) times daily. 30 tablet 3   MOUNJARO 2.5 MG/0.5ML Pen Inject 2.5 mg into the skin every Monday.     NP THYROID  15 MG tablet Take 15 mg by mouth See admin instructions. 15 mg + 60 mg=75 mg (take 75 mg dose by mouth every other day--alternating with 90 mg dosage)     NP THYROID 90 MG tablet Take 90 mg by mouth every other day. (Take 90 mg dose by mouth every other day--alternating with 75 mg dosage)     oxyCODONE (ROXICODONE) 5 MG immediate release tablet Take 1 tablet (5 mg total) by mouth every 4 (four) hours as needed for severe pain or breakthrough pain. 5 tablet 0   oxyCODONE (ROXICODONE) 5 MG immediate release tablet Take 1 tablet (5 mg total) by mouth every 4 (four) hours as needed for severe pain. 5 tablet 0   oxyCODONE (ROXICODONE) 5 MG immediate release tablet Take 1 tablet (5 mg total) by mouth every 4 (four) hours as needed for severe pain or breakthrough pain. 5 tablet 0   thyroid (ARMOUR) 60 MG tablet Take 60 mg by mouth See admin instructions. 60 mg +  15 mg=75 mg (take 75 mg dose by mouth every other day--alternating with 90 mg dosage)     Triamcinolone Acetonide (NASACORT AQ NA) Place 1 spray into the nose every evening.     No current facility-administered medications for this visit.   No results found.  Review of Systems:   A ROS was performed including pertinent positives and negatives as documented in the HPI.   Musculoskeletal Exam:    There were no vitals taken for this visit.  Right shoulder incisions are well-appearing without erythema or drainage.  He is transition she can forward elevate to 120 degrees of external rotation at side to 30 degrees internal rotation is to back pocket.  Intact distal neurosensory exam  Imaging:      I personally reviewed and interpreted the radiographs.   Assessment:   Status post right shoulder labral repair unfortunately with evidence of adhesive capsulitis.  Unfortunately she is having a very difficult time with this postoperatively.  She is quite restricted with range of motion.  Given the fact that her range of motion restrictions have remained quite severe we did discuss a capsular release.  I did describe that I would like her to work through 1 additional month of physical therapy and if she is not any improved after this that we would likely plan for a right capsular release Plan :    -Return to clinic in 1 month  I personally saw and evaluated the patient, and participated in the management and treatment plan.  Huel Cote, MD Attending Physician, Orthopedic Surgery  This document was dictated using Dragon voice recognition software. A reasonable attempt at proof reading has been made to minimize errors.

## 2022-12-03 NOTE — Therapy (Signed)
OUTPATIENT PHYSICAL THERAPY SHOULDER TREATMENT   Patient Name: Crystal Parks MRN: [id number] DOB:April 27, 1975, 47 y.o., female Today's Date: 12/03/2022  END OF SESSION:  PT End of Session - 12/03/22 1022     Visit Number 11    Number of Visits 16    Date for PT Re-Evaluation 12/21/22    Authorization Type UHC    PT Start Time 1018    Activity Tolerance Patient tolerated treatment well    Behavior During Therapy [facility] for tasks assessed/performed                  Past Medical History:  Diagnosis Date   ADHD (attention deficit hyperactivity disorder)    Arthritis    bilateral hands/fingers   Complication of anesthesia    Woke up extremely cold after anesthesia. Required warm blankets.   Headache    Migraine   Hypothyroidism    Thyroid disease    Past Surgical History:  Procedure Laterality Date   ceas     c-section   KNEE ARTHROSCOPY WITH MENISCAL REPAIR Left 06/04/2022   Procedure: LEFT KNEE ARTHROSCOPY WITH MEDIAL MENISCAL REPAIR;  Surgeon: Huel Cote, MD;  Location: [facility] OR;  Service: Orthopedics;  Laterality: Left;   REDUCTION MAMMAPLASTY Bilateral    age 33   WISDOM TOOTH EXTRACTION     Patient Active Problem List   Diagnosis Date Noted   Acute medial meniscus tear of left knee 06/04/2022   Inappropriate sinus tachycardia (HCC) 06/30/2018   Palpitations 06/30/2018   Acute cystitis with hematuria 11/21/2016   Hypothyroidism 11/29/2015   Impaired fasting glucose 11/29/2015   Myalgia 11/29/2015   Female pattern alopecia 11/15/2011   Telogen effluvium 11/15/2011   ALLERGIC RHINITIS 10/18/2009   ASTHMA 10/18/2009   Asthma 10/18/2009    [facility]   REFERRING PROVIDER: Dr Huel Cote   REFERRING DIAG: RIGHT SHOULDER LABRAL TEAR   THERAPY DIAG:  No diagnosis found.  Rationale for Evaluation and Treatment: Rehabilitation  ONSET DATE:   Days since surgery: 09/24/2022 Surgical date    PROCEDURE: 1.  Right shoulder anterior  labral repair 2.  Right shoulder limited debridement   SUBJECTIVE:                                                                                                                                                                                      SUBJECTIVE STATEMENT: The patient reports her pain is improved.  She is still frustrated by her lack of movement.  She is seen the MD.  He reports it is not better in a month he will do a capsular release.  She has been working  on her exercises much as possible.  Eval:Patient has long history of right shoulder instability and pain.  She had a right shoulder labral repair on 09/24/2022.  At this time her pain is well-controlled.  She is currently compliant with sling use.  She did recently have a meniscal repair.  Hand dominance: Right  PERTINENT HISTORY: Left knee meniscal repair   PAIN:  Are you having pain? Yes: NPRS scale: 2/10 at worst  Pain location: right anterior shoulder  Pain description: constant, mild ache  Aggravating factors: rest  Relieving factors:   PRECAUTIONS: Shoulder  RED FLAGS: None   WEIGHT BEARING RESTRICTIONS: Yes NWB  FALLS:  Has patient fallen in last 6 months? 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   PLOF: Independent   Hobbies:  Martial arts  [employer] training  PATIENT GOALS:   NEXT MD VISIT:   OBJECTIVE:   DIAGNOSTIC FINDINGS:  Nothing post-op  PATIENT SURVEYS:  FOTO    COGNITION: Overall cognitive status: Within functional limits for tasks assessed     SENSATION: Some impaired sesation into her thumb   POSTURE: Good   UPPER EXTREMITY ROM:   Passive ROM Right eval R  10/28 Left eval  Shoulder flexion 80 without resistance  AAROM   110   Shoulder extension  30   Shoulder abduction     Shoulder adduction  AAROM 100   Shoulder internal rotation To stomach  60 deg    Shoulder external rotation 4 8 AAROM at 0 deg     Elbow flexion     Elbow extension     Wrist flexion     Wrist extension     Wrist ulnar deviation     Wrist radial deviation     Wrist pronation     Wrist supination     (Blank rows = not tested)  UPPER EXTREMITY [id number]:[id number] to test      PALPATION:  TTP of deltoid, levator, UT, infra   TODAY'S TREATMENT:                                                                                                                                         DATE:  11/4 Active ER 2x10 Wand flexion 3x10 2lbs   Rows 3x10 10 lbs   Shoulder extension 3x10  10 lbs   Manual: PROM into protocol limits  Trigger point release to upper trap and posterior shoulder; Grade II and III PA and AP mobilization    10/28  Exercises - Standing Single Arm Elbow Flexion with Resistance  - 1 x daily - 7 x weekly - 3 sets - 10 reps - Prone Shoulder Row  - 1 x daily - 7 x weekly - 3 sets - 10 reps - Doorway Pec Stretch at 60 Degrees Abduction with Arm Straight  - 2 x daily -  7 x weekly - 1 sets - 3 reps - 30 hold - Seated Shoulder Flexion Towel Slide at Table Top  - 1 x daily - 7 x weekly - 1 sets - 1 reps - 5 min hold - Seated Shoulder Abduction Towel Slide at Table Top  - 1 x daily - 7 x weekly - 1 sets - 1 reps - 5 min hold - Seated Shoulder Abduction AAROM with Pulley Behind  - 1 x daily - 7 x weekly - 1 sets - 1 reps - 5 min hold - Seated Shoulder Flexion AAROM with Pulley Behind  - 1 x daily - 7 x weekly - 1 sets - 1 reps - 5 min hold - Standing Shoulder Shrug and Retraction with Resistance  - 1 x daily - 7 x weekly - 2 sets - 10 reps - 2 hold - Seated Shoulder External Rotation AAROM with Cane and Hand in Neutral  - 1 x daily - 7 x weekly - 1 sets - 1 reps - 5 min hold    10/10  Manual: trigger point release to upper trap/ deltoid/ Posterior shoulder; inferior and posterior glides grade 2 and 3 to reduce pain and improve movement.  Pec release.  Slow rhythmic ER IR in pain-free ranges.  Treatment  instructions: Expect mild to moderate muscle soreness. S/S of pneumothorax if dry needled over a lung field, and to seek immediate medical attention should they occur. Patient verbalized understanding of these instructions and education.  Patient Consent Given: Yes Education handout provided: Yes Muscles treated: 2x in anterior and medial deltoid 1x with .30x50 needle upper trap 2x with a .30x50 needle  Electrical stimulation performed: No Parameters: N/A Treatment response/outcome: great twitch   10/7 Trigger Point Dry-Needling  Treatment instructions: Expect mild to moderate muscle soreness. S/S of pneumothorax if dry needled over a lung field, and to seek immediate medical attention should they occur. Patient verbalized understanding of these instructions and education.  Patient Consent Given: Yes Education handout provided: Yes Muscles treated: sub-scap 2x with a .30x50 needle teres minor, 1x with .30x50 needle upper trap 2x with a .30x50 needle  Electrical stimulation performed: No Parameters: N/A Treatment response/outcome: great twitch   Wand er 3x 5 5 sec hold  Wand flexion 3x 5 sec hols       9/30  STM R UT, deltoid, infra  PROM to tolerance 4-8 wks protocol limits( flexion 160, ABD 160, ER to 45)   Cane ABD 2x10 Pec stretch attempted but too painful Self STM for rear delt   9/23  STM R UT, pec, deltoid PROM to protocol limits  Self STM and pain management options  9/16  Manual: PROM into protocol limits  Trigger point release to upper trap and posterior shoulder; Grade II and III PA and AP mobilization   Wand flex in pain free range 2x15 spent time reviewing pain free ranges and how to strengthen.  Prone row 2x12 Prone extension 2x10    Supine ER 3x10    Given for HEP   9/11 Manual: PROM into protocol limits  Trigger point release to upper trap and posterior shoulder   Wand press 2x15  Wand flex in pain free range 2x15   ER iso  x15    9/5 Manual: PROM into protocol limits   Self flexion x10 in sitting  Self ER x10 in sitting supported   Pulley 2 min with min cuing.   Scap retraction 2x20  Sling isometrics 10x 5  sec hold ER/IR Flexion      Eval:Access Code: [email or web address] URL: [email or web address] Date: 09/27/2022 Prepared by: Lorayne Bender  Exercises - Tip Pinch with Putty  - 1 x daily - 7 x weekly - 3 sets - 10 reps - Key Pinch with Putty  - 1 x daily - 7 x weekly - 3 sets - 10 reps - Thumb MCP and IP Flexion with Putty  - 1 x daily - 7 x weekly - 3 sets - 10 reps - Circular Shoulder Pendulum with Table Support  - 1 x daily - 7 x weekly - 3 sets - 10 reps - Standing Single Arm Elbow Flexion with Resistance  - 1 x daily - 7 x weekly - 3 sets - 10 reps  Manual: PROM into all planes per protocol    PATIENT EDUCATION: Education details: HEP, symptom management,  Person educated: Patient Education method: Explanation, Demonstration, Tactile cues, Verbal cues, and Handouts Education comprehension: verbalized understanding, returned demonstration, verbal cues required, tactile cues required, and needs further education  HOME EXERCISE PROGRAM: Access Code: [email or web address] URL: [email or web address] Date: 09/27/2022 Prepared by: Lorayne Bender  Exercises - Tip Pinch with Putty  - 1 x daily - 7 x weekly - 3 sets - 10 reps - Key Pinch with Putty  - 1 x daily - 7 x weekly - 3 sets - 10 reps - Thumb MCP and IP Flexion with Putty  - 1 x daily - 7 x weekly - 3 sets - 10 reps - Circular Shoulder Pendulum with Table Support  - 1 x daily - 7 x weekly - 3 sets - 10 reps - Standing Single Arm Elbow Flexion with Resistance  - 1 x daily - 7 x weekly - 3 sets - 10 reps  ASSESSMENT:  CLINICAL IMPRESSION:  The patient is making progress.  Motions improved since last 2 visits.  We are able to work her back into some exercises today.  She remains frustrated but she is advised that she is doing better  than she did 2 weeks ago.  She will continue to work on her pain-free ranges as much as she can.  Therapy will continue to progress as tolerated.Marland Kitchen  FOTO at next as time allows.  Eval: Patient is a 47 year old female status post right labral repair on 09/24/2022.  At this time her pain is well-controlled.  She presents with expected limitations in range of motion, strength, and functional mobility.  She is very active in martial arts prior to her surgery.  She would like to return to those activities.  She would also like to begin resistance training.  She would benefit from skilled therapy to improve functional mobility of dominant arm. OBJECTIVE IMPAIRMENTS: decreased knowledge of condition, decreased ROM, decreased strength, increased muscle spasms, impaired UE functional use, and pain.   ACTIVITY LIMITATIONS: carrying, lifting, sleeping, bathing, dressing, self feeding, and reach over head  PARTICIPATION LIMITATIONS: meal prep, cleaning, laundry, driving, shopping, community activity, occupation, and yard work  PERSONAL FACTORS: 1-2 comorbidities: recent knee surgery   are also affecting patient's functional outcome.   REHAB POTENTIAL: Excellent  CLINICAL DECISION MAKING: Stable/uncomplicated  EVALUATION COMPLEXITY: Low   GOALS: Goals reviewed with patient? Yes  SHORT TERM GOALS: Target date: 11/08/2022    Patient will increase passive right shoulder flexion to 90 degrees Baseline: Goal status: MET  2.  Patient will increase passive shoulder external rotation to 30 degrees right Baseline:  Goal status: ongoing  3.  Patient will wean out  of sling when directed by MD Baseline:  Goal status: met  4.  Patient will be independent with base exercise program Baseline:  Goal status: met  LONG TERM GOALS: Target date: 12/21/2022      Patient will reach overhead with 2 pound weight without increased pain in order to perform ADLs Baseline:  Goal status: ongoing  2.   Patient will reach behind her back without pain in order to perform ADLs Baseline:  Goal status: ongoing  3.  Patient will reach behind her head without pain in order to perform ADLs Baseline:  Goal status: ongoing  4.  Patient will return to light gym program when cleared by MD Baseline:  Goal status: ongoing PLAN:  PT FREQUENCY: 2x/week  PT DURATION: 12 weeks  PLANNED INTERVENTIONS:  Therapeutic exercises, Therapeutic activity, Neuromuscular re-education, Balance training, Gait training, Patient/Family education, Self Care, Joint mobilization, Stair training, DME instructions, Aquatic Therapy, Dry Needling, Electrical stimulation, Cryotherapy, Moist heat, Taping, Manual therapy, and Re-evaluation.   PLAN FOR NEXT SESSION: begin per shoulder instability protocol    Dessie Coma, PT 12/03/2022, 10:44 AM

## 2022-12-10 ENCOUNTER — Encounter (HOSPITAL_BASED_OUTPATIENT_CLINIC_OR_DEPARTMENT_OTHER): Payer: Self-pay | Admitting: Physical Therapy

## 2022-12-10 ENCOUNTER — Ambulatory Visit (HOSPITAL_BASED_OUTPATIENT_CLINIC_OR_DEPARTMENT_OTHER): Payer: 59 | Admitting: Physical Therapy

## 2022-12-10 DIAGNOSIS — M25611 Stiffness of right shoulder, not elsewhere classified: Secondary | ICD-10-CM

## 2022-12-10 DIAGNOSIS — M25511 Pain in right shoulder: Secondary | ICD-10-CM | POA: Diagnosis not present

## 2022-12-10 DIAGNOSIS — M6281 Muscle weakness (generalized): Secondary | ICD-10-CM

## 2022-12-10 NOTE — Therapy (Signed)
OUTPATIENT PHYSICAL THERAPY SHOULDER TREATMENT   Patient Name: Crystal Parks MRN: [id number] DOB:1975/12/15, 47 y.o., female Today's Date: 12/10/2022  END OF SESSION:  PT End of Session - 12/10/22 1056     Visit Number 12    Number of Visits 16    Date for PT Re-Evaluation 12/21/22    Authorization Type UHC    Activity Tolerance Patient tolerated treatment well    Behavior During Therapy [facility] for tasks assessed/performed                  Past Medical History:  Diagnosis Date   ADHD (attention deficit hyperactivity disorder)    Arthritis    bilateral hands/fingers   Complication of anesthesia    Woke up extremely cold after anesthesia. Required warm blankets.   Headache    Migraine   Hypothyroidism    Thyroid disease    Past Surgical History:  Procedure Laterality Date   ceas     c-section   KNEE ARTHROSCOPY WITH MENISCAL REPAIR Left 06/04/2022   Procedure: LEFT KNEE ARTHROSCOPY WITH MEDIAL MENISCAL REPAIR;  Surgeon: Huel Cote, MD;  Location: [facility] OR;  Service: Orthopedics;  Laterality: Left;   REDUCTION MAMMAPLASTY Bilateral    age 15   WISDOM TOOTH EXTRACTION     Patient Active Problem List   Diagnosis Date Noted   Acute medial meniscus tear of left knee 06/04/2022   Inappropriate sinus tachycardia (HCC) 06/30/2018   Palpitations 06/30/2018   Acute cystitis with hematuria 11/21/2016   Hypothyroidism 11/29/2015   Impaired fasting glucose 11/29/2015   Myalgia 11/29/2015   Female pattern alopecia 11/15/2011   Telogen effluvium 11/15/2011   ALLERGIC RHINITIS 10/18/2009   ASTHMA 10/18/2009   Asthma 10/18/2009    [facility]   REFERRING PROVIDER: Dr Huel Cote   REFERRING DIAG: RIGHT SHOULDER LABRAL TEAR   THERAPY DIAG:  No diagnosis found.  Rationale for Evaluation and Treatment: Rehabilitation  ONSET DATE:   Days since surgery: 09/24/2022 Surgical date    PROCEDURE: 1.  Right shoulder anterior labral repair 2.   Right shoulder limited debridement   SUBJECTIVE:                                                                                                                                                                                      SUBJECTIVE STATEMENT: The patient reports her pain is improved.  She is still frustrated by her lack of movement.  She is seen the MD.  He reports it is not better in a month he will do a capsular release.  She has been working on her exercises much as possible.  Eval:Patient has long history of right shoulder instability and pain.  She had a right shoulder labral repair on 09/24/2022.  At this time her pain is well-controlled.  She is currently compliant with sling use.  She did recently have a meniscal repair.  Hand dominance: Right  PERTINENT HISTORY: Left knee meniscal repair   PAIN:  Are you having pain? Yes: NPRS scale: 2/10 at worst  Pain location: right anterior shoulder  Pain description: constant, mild ache  Aggravating factors: rest  Relieving factors:   PRECAUTIONS: Shoulder  RED FLAGS: None   WEIGHT BEARING RESTRICTIONS: Yes NWB  FALLS:  Has patient fallen in last 6 months? 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   PLOF: Independent   Hobbies:  Martial arts  [employer] training  PATIENT GOALS:   NEXT MD VISIT:   OBJECTIVE:   DIAGNOSTIC FINDINGS:  Nothing post-op  PATIENT SURVEYS:  FOTO    COGNITION: Overall cognitive status: Within functional limits for tasks assessed     SENSATION: Some impaired sesation into her thumb   POSTURE: Good   UPPER EXTREMITY ROM:   Passive ROM Right eval R  10/28 Left eval  Shoulder flexion 80 without resistance  AAROM   110   Shoulder extension  30   Shoulder abduction     Shoulder adduction  AAROM 100   Shoulder internal rotation To stomach  60 deg    Shoulder external rotation 4 8 AAROM at 0 deg    Elbow flexion      Elbow extension     Wrist flexion     Wrist extension     Wrist ulnar deviation     Wrist radial deviation     Wrist pronation     Wrist supination     (Blank rows = not tested)  UPPER EXTREMITY [id number] to test      PALPATION:  TTP of deltoid, levator, UT, infra   TODAY'S TREATMENT:                                                                                                                                         DATE:  11//11 Manual: PROM into protocol limits  Trigger point release to upper trap and posterior shoulder; Grade II and III PA and AP mobilization   UBE 2 lbs fwd and 2 back   Shoulder flexion 2lbs 3x10  SL ER 3x10 1 lbs SL abduction 3x10 1 lb   Supine ABC 1 lb   Row red 2x15 showed patient how to set up for home   11/4 Active ER 2x10 Wand flexion 3x10 2lbs   Rows 3x10 10 lbs   Shoulder extension 3x10  10 lbs   Manual: PROM into protocol limits  Trigger point release to upper trap and posterior shoulder; Grade II and III PA and AP mobilization  10/28  Exercises - Standing Single Arm Elbow Flexion with Resistance  - 1 x daily - 7 x weekly - 3 sets - 10 reps - Prone Shoulder Row  - 1 x daily - 7 x weekly - 3 sets - 10 reps - Doorway Pec Stretch at 60 Degrees Abduction with Arm Straight  - 2 x daily - 7 x weekly - 1 sets - 3 reps - 30 hold - Seated Shoulder Flexion Towel Slide at Table Top  - 1 x daily - 7 x weekly - 1 sets - 1 reps - 5 min hold - Seated Shoulder Abduction Towel Slide at Table Top  - 1 x daily - 7 x weekly - 1 sets - 1 reps - 5 min hold - Seated Shoulder Abduction AAROM with Pulley Behind  - 1 x daily - 7 x weekly - 1 sets - 1 reps - 5 min hold - Seated Shoulder Flexion AAROM with Pulley Behind  - 1 x daily - 7 x weekly - 1 sets - 1 reps - 5 min hold - Standing Shoulder Shrug and Retraction with Resistance  - 1 x daily - 7 x weekly - 2 sets - 10 reps - 2 hold - Seated Shoulder External Rotation AAROM with Cane and Hand in  Neutral  - 1 x daily - 7 x weekly - 1 sets - 1 reps - 5 min hold    10/10  Manual: trigger point release to upper trap/ deltoid/ Posterior shoulder; inferior and posterior glides grade 2 and 3 to reduce pain and improve movement.  Pec release.  Slow rhythmic ER IR in pain-free ranges.  Treatment instructions: Expect mild to moderate muscle soreness. S/S of pneumothorax if dry needled over a lung field, and to seek immediate medical attention should they occur. Patient verbalized understanding of these instructions and education.  Patient Consent Given: Yes Education handout provided: Yes Muscles treated: 2x in anterior and medial deltoid 1x with .30x50 needle upper trap 2x with a .30x50 needle  Electrical stimulation performed: No Parameters: N/A Treatment response/outcome: great twitch   10/7 Trigger Point Dry-Needling  Treatment instructions: Expect mild to moderate muscle soreness. S/S of pneumothorax if dry needled over a lung field, and to seek immediate medical attention should they occur. Patient verbalized understanding of these instructions and education.  Patient Consent Given: Yes Education handout provided: Yes Muscles treated: sub-scap 2x with a .30x50 needle teres minor, 1x with .30x50 needle upper trap 2x with a .30x50 needle  Electrical stimulation performed: No Parameters: N/A Treatment response/outcome: great twitch   Wand er 3x 5 5 sec hold  Wand flexion 3x 5 sec hols       9/30  STM R UT, deltoid, infra  PROM to tolerance 4-8 wks protocol limits( flexion 160, ABD 160, ER to 45)   Cane ABD 2x10 Pec stretch attempted but too painful Self STM for rear delt     PATIENT EDUCATION: Education details: HEP, symptom management,  Person educated: Patient Education method: Explanation, Demonstration, Tactile cues, Verbal cues, and Handouts Education comprehension: verbalized understanding, returned demonstration, verbal cues required, tactile cues  required, and needs further education  HOME EXERCISE PROGRAM: Access Code: [email or web address] URL: [email or web address] Date: 09/27/2022 Prepared by: Lorayne Bender  Exercises - Tip Pinch with Putty  - 1 x daily - 7 x weekly - 3 sets - 10 reps - Key Pinch with Putty  - 1 x daily - 7 x weekly - 3 sets - 10 reps -  Thumb MCP and IP Flexion with Putty  - 1 x daily - 7 x weekly - 3 sets - 10 reps - Circular Shoulder Pendulum with Table Support  - 1 x daily - 7 x weekly - 3 sets - 10 reps - Standing Single Arm Elbow Flexion with Resistance  - 1 x daily - 7 x weekly - 3 sets - 10 reps  ASSESSMENT:  CLINICAL IMPRESSION: The patient continues to make progress. She is still limited in ER but we were able to stretch more today. We were also able to add more exercises today that are in line with her surgical date. She had no significant increase in pain although she could feel it. We reviewed how to progress exercises. We reviewed and updated her HEP. Therapy will continue to progress as tolerated.     FOTO at next as time allows.  Eval: Patient is a 47 year old female status post right labral repair on 09/24/2022.  At this time her pain is well-controlled.  She presents with expected limitations in range of motion, strength, and functional mobility.  She is very active in martial arts prior to her surgery.  She would like to return to those activities.  She would also like to begin resistance training.  She would benefit from skilled therapy to improve functional mobility of dominant arm. OBJECTIVE IMPAIRMENTS: decreased knowledge of condition, decreased ROM, decreased strength, increased muscle spasms, impaired UE functional use, and pain.   ACTIVITY LIMITATIONS: carrying, lifting, sleeping, bathing, dressing, self feeding, and reach over head  PARTICIPATION LIMITATIONS: meal prep, cleaning, laundry, driving, shopping, community activity, occupation, and yard work  PERSONAL FACTORS: 1-2  comorbidities: recent knee surgery   are also affecting patient's functional outcome.   REHAB POTENTIAL: Excellent  CLINICAL DECISION MAKING: Stable/uncomplicated  EVALUATION COMPLEXITY: Low   GOALS: Goals reviewed with patient? Yes  SHORT TERM GOALS: Target date: 11/08/2022    Patient will increase passive right shoulder flexion to 90 degrees Baseline: Goal status: MET  2.  Patient will increase passive shoulder external rotation to 30 degrees right Baseline:  Goal status: ongoing  3.  Patient will wean out of sling when directed by MD Baseline:  Goal status: met  4.  Patient will be independent with base exercise program Baseline:  Goal status: met  LONG TERM GOALS: Target date: 12/21/2022      Patient will reach overhead with 2 pound weight without increased pain in order to perform ADLs Baseline:  Goal status: ongoing  2.  Patient will reach behind her back without pain in order to perform ADLs Baseline:  Goal status: ongoing  3.  Patient will reach behind her head without pain in order to perform ADLs Baseline:  Goal status: ongoing  4.  Patient will return to light gym program when cleared by MD Baseline:  Goal status: ongoing PLAN:  PT FREQUENCY: 2x/week  PT DURATION: 12 weeks  PLANNED INTERVENTIONS:  Therapeutic exercises, Therapeutic activity, Neuromuscular re-education, Balance training, Gait training, Patient/Family education, Self Care, Joint mobilization, Stair training, DME instructions, Aquatic Therapy, Dry Needling, Electrical stimulation, Cryotherapy, Moist heat, Taping, Manual therapy, and Re-evaluation.   PLAN FOR NEXT SESSION: begin per shoulder instability protocol    Dessie Coma, PT 12/10/2022, 10:56 AM

## 2022-12-17 ENCOUNTER — Encounter (HOSPITAL_BASED_OUTPATIENT_CLINIC_OR_DEPARTMENT_OTHER): Payer: Self-pay | Admitting: Physical Therapy

## 2022-12-17 ENCOUNTER — Ambulatory Visit (HOSPITAL_BASED_OUTPATIENT_CLINIC_OR_DEPARTMENT_OTHER): Payer: 59 | Admitting: Physical Therapy

## 2022-12-17 DIAGNOSIS — M6281 Muscle weakness (generalized): Secondary | ICD-10-CM

## 2022-12-17 DIAGNOSIS — M25611 Stiffness of right shoulder, not elsewhere classified: Secondary | ICD-10-CM

## 2022-12-17 DIAGNOSIS — M25511 Pain in right shoulder: Secondary | ICD-10-CM | POA: Diagnosis not present

## 2022-12-17 NOTE — Therapy (Signed)
OUTPATIENT PHYSICAL THERAPY SHOULDER TREATMENT   Patient Name: Crystal Parks MRN: [id number] DOB:07-Jul-1975, 47 y.o., female Today's Date: 12/17/2022  END OF SESSION:  PT End of Session - 12/17/22 1022     Visit Number 13    Number of Visits 16    Date for PT Re-Evaluation 12/21/22    PT Start Time 1019    PT Stop Time 1059    PT Time Calculation (min) 40 min    Activity Tolerance Patient tolerated treatment well    Behavior During Therapy WFL for tasks assessed/performed                  Past Medical History:  Diagnosis Date   ADHD (attention deficit hyperactivity disorder)    Arthritis    bilateral hands/fingers   Complication of anesthesia    Woke up extremely cold after anesthesia. Required warm blankets.   Headache    Migraine   Hypothyroidism    Thyroid disease    Past Surgical History:  Procedure Laterality Date   ceas     c-section   KNEE ARTHROSCOPY WITH MENISCAL REPAIR Left 06/04/2022   Procedure: LEFT KNEE ARTHROSCOPY WITH MEDIAL MENISCAL REPAIR;  Surgeon: Huel Cote, MD;  Location: [facility] OR;  Service: Orthopedics;  Laterality: Left;   REDUCTION MAMMAPLASTY Bilateral    age 53   WISDOM TOOTH EXTRACTION     Patient Active Problem List   Diagnosis Date Noted   Acute medial meniscus tear of left knee 06/04/2022   Inappropriate sinus tachycardia (HCC) 06/30/2018   Palpitations 06/30/2018   Acute cystitis with hematuria 11/21/2016   Hypothyroidism 11/29/2015   Impaired fasting glucose 11/29/2015   Myalgia 11/29/2015   Female pattern alopecia 11/15/2011   Telogen effluvium 11/15/2011   ALLERGIC RHINITIS 10/18/2009   ASTHMA 10/18/2009   Asthma 10/18/2009    [facility]   REFERRING PROVIDER: Dr Huel Cote   REFERRING DIAG: RIGHT SHOULDER LABRAL TEAR   THERAPY DIAG:  Right shoulder pain, unspecified chronicity  Stiffness of right shoulder, not elsewhere classified  Muscle weakness (generalized)  Rationale for  Evaluation and Treatment: Rehabilitation  ONSET DATE:   Days since surgery: 09/24/2022 Surgical date    PROCEDURE: 1.  Right shoulder anterior labral repair 2.  Right shoulder limited debridement   SUBJECTIVE:                                                                                                                                                                                      SUBJECTIVE STATEMENT: The patient feels like her shoulder is improving but still frozen,    Eval:Patient has long history of right  shoulder instability and pain.  She had a right shoulder labral repair on 09/24/2022.  At this time her pain is well-controlled.  She is currently compliant with sling use.  She did recently have a meniscal repair.  Hand dominance: Right  PERTINENT HISTORY: Left knee meniscal repair   PAIN:  Are you having pain? Yes: NPRS scale: 2/10 at worst  Pain location: right anterior shoulder  Pain description: constant, mild ache  Aggravating factors: rest  Relieving factors:   PRECAUTIONS: Shoulder  RED FLAGS: None   WEIGHT BEARING RESTRICTIONS: Yes NWB  FALLS:  Has patient fallen in last 6 months? 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   PLOF: Independent   Hobbies:  Martial arts  [employer] training  PATIENT GOALS:   NEXT MD VISIT:   OBJECTIVE:   DIAGNOSTIC FINDINGS:  Nothing post-op  PATIENT SURVEYS:  FOTO    COGNITION: Overall cognitive status: Within functional limits for tasks assessed     SENSATION: Some impaired sesation into her thumb   POSTURE: Good   UPPER EXTREMITY ROM:   Passive ROM Right eval R  10/28 R eval  Shoulder flexion 80 without resistance  AAROM   110   140  Shoulder extension  30   Shoulder abduction     Shoulder adduction  AAROM 100   Shoulder internal rotation To stomach  60 deg  65  Shoulder external rotation 4 8 AAROM at 0 deg  22  Elbow flexion      Elbow extension     Wrist flexion     Wrist extension     Wrist ulnar deviation     Wrist radial deviation     Wrist pronation     Wrist supination     (Blank rows = not tested)  UPPER EXTREMITY ZOX:[id number] to test   Active 145   Active ER tight behind the head   Active IR to L5 with pain       PALPATION:  TTP of deltoid, levator, UT, infra   TODAY'S TREATMENT:                                                                                                                                         DATE:  11/8 Manual: PROM into protocol limits  Trigger point release to upper trap and posterior shoulder; Grade II and III PA and AP mobilization    UBE 2 lbs fwd and 2 back   SL ER 3x10 2 lbs  ABC 2lbs   Bicpes curls 8 lbs 3x10   IR stretch 5x 5 sec hold   11//11 Manual: PROM into protocol limits  Trigger point release to upper trap and posterior shoulder; Grade II and III PA and AP mobilization   UBE 2 lbs fwd and 2 back   Shoulder flexion 2lbs 3x10  SL  ER 3x10 1 lbs SL abduction 3x10 1 lb   Supine ABC 1 lb   Row red 2x15 showed patient how to set up for home   11/4 Active ER 2x10 Wand flexion 3x10 2lbs   Rows 3x10 10 lbs   Shoulder extension 3x10  10 lbs   Manual: PROM into protocol limits  Trigger point release to upper trap and posterior shoulder; Grade II and III PA and AP mobilization    10/28  Exercises - Standing Single Arm Elbow Flexion with Resistance  - 1 x daily - 7 x weekly - 3 sets - 10 reps - Prone Shoulder Row  - 1 x daily - 7 x weekly - 3 sets - 10 reps - Doorway Pec Stretch at 60 Degrees Abduction with Arm Straight  - 2 x daily - 7 x weekly - 1 sets - 3 reps - 30 hold - Seated Shoulder Flexion Towel Slide at Table Top  - 1 x daily - 7 x weekly - 1 sets - 1 reps - 5 min hold - Seated Shoulder Abduction Towel Slide at Table Top  - 1 x daily - 7 x weekly - 1 sets - 1 reps - 5 min hold - Seated Shoulder Abduction AAROM with Pulley  Behind  - 1 x daily - 7 x weekly - 1 sets - 1 reps - 5 min hold - Seated Shoulder Flexion AAROM with Pulley Behind  - 1 x daily - 7 x weekly - 1 sets - 1 reps - 5 min hold - Standing Shoulder Shrug and Retraction with Resistance  - 1 x daily - 7 x weekly - 2 sets - 10 reps - 2 hold - Seated Shoulder External Rotation AAROM with Cane and Hand in Neutral  - 1 x daily - 7 x weekly - 1 sets - 1 reps - 5 min hold    10/10  Manual: trigger point release to upper trap/ deltoid/ Posterior shoulder; inferior and posterior glides grade 2 and 3 to reduce pain and improve movement.  Pec release.  Slow rhythmic ER IR in pain-free ranges.  Treatment instructions: Expect mild to moderate muscle soreness. S/S of pneumothorax if dry needled over a lung field, and to seek immediate medical attention should they occur. Patient verbalized understanding of these instructions and education.  Patient Consent Given: Yes Education handout provided: Yes Muscles treated: 2x in anterior and medial deltoid 1x with .30x50 needle upper trap 2x with a .30x50 needle  Electrical stimulation performed: No Parameters: N/A Treatment response/outcome: great twitch   10/7 Trigger Point Dry-Needling  Treatment instructions: Expect mild to moderate muscle soreness. S/S of pneumothorax if dry needled over a lung field, and to seek immediate medical attention should they occur. Patient verbalized understanding of these instructions and education.  Patient Consent Given: Yes Education handout provided: Yes Muscles treated: sub-scap 2x with a .30x50 needle teres minor, 1x with .30x50 needle upper trap 2x with a .30x50 needle  Electrical stimulation performed: No Parameters: N/A Treatment response/outcome: great twitch   Wand er 3x 5 5 sec hold  Wand flexion 3x 5 sec hols       9/30  STM R UT, deltoid, infra  PROM to tolerance 4-8 wks protocol limits( flexion 160, ABD 160, ER to 45)   Cane ABD 2x10 Pec stretch  attempted but too painful Self STM for rear delt     PATIENT EDUCATION: Education details: HEP, symptom management,  Person educated: Patient Education method: Explanation, Demonstration,  Tactile cues, Verbal cues, and Handouts Education comprehension: verbalized understanding, returned demonstration, verbal cues required, tactile cues required, and needs further education  HOME EXERCISE PROGRAM: Access Code: [email or web address] URL: [email or web address] Date: 09/27/2022 Prepared by: Lorayne Bender  Exercises - Tip Pinch with Putty  - 1 x daily - 7 x weekly - 3 sets - 10 reps - Key Pinch with Putty  - 1 x daily - 7 x weekly - 3 sets - 10 reps - Thumb MCP and IP Flexion with Putty  - 1 x daily - 7 x weekly - 3 sets - 10 reps - Circular Shoulder Pendulum with Table Support  - 1 x daily - 7 x weekly - 3 sets - 10 reps - Standing Single Arm Elbow Flexion with Resistance  - 1 x daily - 7 x weekly - 3 sets - 10 reps  ASSESSMENT:  CLINICAL IMPRESSION: The patient is making progress. Her passive ER and IR have improved significantly. She is tolerating  her exercises well. Her motion is still limited. Therapy will continue to progress as tolerated. We also reviewed IR stretching. We will contiue 1W8.    FOTO at next as time allows.  Eval: Patient is a 47 year old female status post right labral repair on 09/24/2022.  At this time her pain is well-controlled.  She presents with expected limitations in range of motion, strength, and functional mobility.  She is very active in martial arts prior to her surgery.  She would like to return to those activities.  She would also like to begin resistance training.  She would benefit from skilled therapy to improve functional mobility of dominant arm. OBJECTIVE IMPAIRMENTS: decreased knowledge of condition, decreased ROM, decreased strength, increased muscle spasms, impaired UE functional use, and pain.   ACTIVITY LIMITATIONS: carrying, lifting,  sleeping, bathing, dressing, self feeding, and reach over head  PARTICIPATION LIMITATIONS: meal prep, cleaning, laundry, driving, shopping, community activity, occupation, and yard work  PERSONAL FACTORS: 1-2 comorbidities: recent knee surgery   are also affecting patient's functional outcome.   REHAB POTENTIAL: Excellent  CLINICAL DECISION MAKING: Stable/uncomplicated  EVALUATION COMPLEXITY: Low   GOALS: Goals reviewed with patient? Yes  SHORT TERM GOALS: Target date: 11/08/2022    Patient will increase passive right shoulder flexion to 90 degrees Baseline: Goal status: MET  2.  Patient will increase passive shoulder external rotation to 30 degrees right Baseline:  Goal status: ongoing  3.  Patient will wean out of sling when directed by MD Baseline:  Goal status: met  4.  Patient will be independent with base exercise program Baseline:  Goal status: met  LONG TERM GOALS: Target date: 12/21/2022      Patient will reach overhead with 2 pound weight without increased pain in order to perform ADLs Baseline:  Goal status: ongoing  2.  Patient will reach behind her back without pain in order to perform ADLs Baseline:  Goal status: ongoing  3.  Patient will reach behind her head without pain in order to perform ADLs Baseline:  Goal status: ongoing  4.  Patient will return to light gym program when cleared by MD Baseline:  Goal status: ongoing PLAN:  PT FREQUENCY: 2x/week  PT DURATION: 12 weeks  PLANNED INTERVENTIONS:  Therapeutic exercises, Therapeutic activity, Neuromuscular re-education, Balance training, Gait training, Patient/Family education, Self Care, Joint mobilization, Stair training, DME instructions, Aquatic Therapy, Dry Needling, Electrical stimulation, Cryotherapy, Moist heat, Taping, Manual therapy, and Re-evaluation.   PLAN FOR NEXT SESSION: continue stretching and strengthening.  Dessie Coma, PT 12/17/2022, 10:30 AM

## 2022-12-31 ENCOUNTER — Ambulatory Visit (HOSPITAL_BASED_OUTPATIENT_CLINIC_OR_DEPARTMENT_OTHER): Payer: 59 | Attending: Orthopaedic Surgery | Admitting: Physical Therapy

## 2022-12-31 DIAGNOSIS — M6281 Muscle weakness (generalized): Secondary | ICD-10-CM | POA: Insufficient documentation

## 2022-12-31 DIAGNOSIS — M25511 Pain in right shoulder: Secondary | ICD-10-CM | POA: Diagnosis present

## 2022-12-31 DIAGNOSIS — M25611 Stiffness of right shoulder, not elsewhere classified: Secondary | ICD-10-CM | POA: Insufficient documentation

## 2022-12-31 DIAGNOSIS — R262 Difficulty in walking, not elsewhere classified: Secondary | ICD-10-CM | POA: Diagnosis present

## 2022-12-31 NOTE — Therapy (Signed)
OUTPATIENT PHYSICAL THERAPY SHOULDER TREATMENT   Patient Name: Crystal Parks MRN: [id number] DOB:07-03-1975, 47 y.o., female Today's Date: 01/01/2023  END OF SESSION:  PT End of Session - 12/31/22 1231     Visit Number 14    Number of Visits 16    Date for PT Re-Evaluation 02/11/23    Authorization Type [employer]    PT Start Time 1100    PT Stop Time 1141    PT Time Calculation (min) 41 min    Activity Tolerance Patient tolerated treatment well    Behavior During Therapy WFL for tasks assessed/performed                   Past Medical History:  Diagnosis Date   ADHD (attention deficit hyperactivity disorder)    Arthritis    bilateral hands/fingers   Complication of anesthesia    Woke up extremely cold after anesthesia. Required warm blankets.   Headache    Migraine   Hypothyroidism    Thyroid disease    Past Surgical History:  Procedure Laterality Date   ceas     c-section   KNEE ARTHROSCOPY WITH MENISCAL REPAIR Left 06/04/2022   Procedure: LEFT KNEE ARTHROSCOPY WITH MEDIAL MENISCAL REPAIR;  Surgeon: Huel Cote, MD;  Location: [facility] OR;  Service: Orthopedics;  Laterality: Left;   REDUCTION MAMMAPLASTY Bilateral    age 69   WISDOM TOOTH EXTRACTION     Patient Active Problem List   Diagnosis Date Noted   Acute medial meniscus tear of left knee 06/04/2022   Inappropriate sinus tachycardia (HCC) 06/30/2018   Palpitations 06/30/2018   Acute cystitis with hematuria 11/21/2016   Hypothyroidism 11/29/2015   Impaired fasting glucose 11/29/2015   Myalgia 11/29/2015   Female pattern alopecia 11/15/2011   Telogen effluvium 11/15/2011   ALLERGIC RHINITIS 10/18/2009   ASTHMA 10/18/2009   Asthma 10/18/2009    [facility]   REFERRING PROVIDER: Dr Huel Cote   REFERRING DIAG: RIGHT SHOULDER LABRAL TEAR   THERAPY DIAG:  Right shoulder pain, unspecified chronicity  Stiffness of right shoulder, not elsewhere classified  Muscle weakness  (generalized)  Rationale for Evaluation and Treatment: Rehabilitation  ONSET DATE:   Days since surgery: 09/24/2022 Surgical date    PROCEDURE: 1.  Right shoulder anterior labral repair 2.  Right shoulder limited debridement   SUBJECTIVE:                                                                                                                                                                                      SUBJECTIVE STATEMENT: The patient's motion has been improving. She has been back to the gym.  She has been advancing her weights. She is overall gdoing better. She is just very tight in IR.    Eval:Patient has long history of right shoulder instability and pain.  She had a right shoulder labral repair on 09/24/2022.  At this time her pain is well-controlled.  She is currently compliant with sling use.  She did recently have a meniscal repair.  Hand dominance: Right  PERTINENT HISTORY: Left knee meniscal repair   PAIN:  Are you having pain? Yes: NPRS scale: 2/10 at worst  Pain location: right anterior shoulder  Pain description: constant, mild ache  Aggravating factors: rest  Relieving factors:   PRECAUTIONS: Shoulder  RED FLAGS: None   WEIGHT BEARING RESTRICTIONS: Yes NWB  FALLS:  Has patient fallen in last 6 months? 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   PLOF: Independent   Hobbies:  Martial arts  [employer] training  PATIENT GOALS:   NEXT MD VISIT:   OBJECTIVE:   DIAGNOSTIC FINDINGS:  Nothing post-op  PATIENT SURVEYS:  FOTO    COGNITION: Overall cognitive status: Within functional limits for tasks assessed     SENSATION: Some impaired sesation into her thumb   POSTURE: Good   UPPER EXTREMITY ROM:   Passive ROM Right eval R  10/28 R eval  Shoulder flexion 80 without resistance  AAROM   110   140  Shoulder extension  30   Shoulder abduction     Shoulder adduction   AAROM 100   Shoulder internal rotation To stomach  60 deg  65  Shoulder external rotation 4 8 AAROM at 0 deg  22  Elbow flexion     Elbow extension     Wrist flexion     Wrist extension     Wrist ulnar deviation     Wrist radial deviation     Wrist pronation     Wrist supination     (Blank rows = not tested)  UPPER EXTREMITY VOZ:[id number] to test   Active 145   Active ER tight behind the head   Active IR to L5 with pain       PALPATION:  TTP of deltoid, levator, UT, infra   TODAY'S TREATMENT:                                                                                                                                         DATE:  12/2 Manual: PROM into protocol limits  Trigger point release to upper trap and posterior shoulder; Grade II and III PA and AP mobilization   IR stretch 5x 5 sec hold   Ball v wall CW CCW up/down/ lateral  X20       11/8 Manual: PROM into protocol limits  Trigger point release to upper trap and posterior shoulder; Grade II and III PA and AP mobilization  UBE 2 lbs fwd and 2 back   SL ER 3x10 2 lbs  ABC 2lbs   Bicpes curls 8 lbs 3x10   IR stretch 5x 5 sec hold   11//11 Manual: PROM into protocol limits  Trigger point release to upper trap and posterior shoulder; Grade II and III PA and AP mobilization   UBE 2 lbs fwd and 2 back   Shoulder flexion 2lbs 3x10  SL ER 3x10 1 lbs SL abduction 3x10 1 lb   Supine ABC 1 lb   Row red 2x15 showed patient how to set up for home       PATIENT EDUCATION: Education details: HEP, symptom management,  Person educated: Patient Education method: Explanation, Demonstration, Tactile cues, Verbal cues, and Handouts Education comprehension: verbalized understanding, returned demonstration, verbal cues required, tactile cues required, and needs further education  HOME EXERCISE PROGRAM: Access Code: [email or web address] URL: [email or web address] Date: 09/27/2022 Prepared by:  Lorayne Bender  Exercises - Tip Pinch with Putty  - 1 x daily - 7 x weekly - 3 sets - 10 reps - Key Pinch with Putty  - 1 x daily - 7 x weekly - 3 sets - 10 reps - Thumb MCP and IP Flexion with Putty  - 1 x daily - 7 x weekly - 3 sets - 10 reps - Circular Shoulder Pendulum with Table Support  - 1 x daily - 7 x weekly - 3 sets - 10 reps - Standing Single Arm Elbow Flexion with Resistance  - 1 x daily - 7 x weekly - 3 sets - 10 reps  ASSESSMENT:  CLINICAL IMPRESSION: The patient continues to progress. We reviewed IR stretching this visit. She continues to be very tight. We adde din endurance exercises as well. We will continue to focus on IR stretching.   FOTO at next as time allows.  Eval: Patient is a 47 year old female status post right labral repair on 09/24/2022.  At this time her pain is well-controlled.  She presents with expected limitations in range of motion, strength, and functional mobility.  She is very active in martial arts prior to her surgery.  She would like to return to those activities.  She would also like to begin resistance training.  She would benefit from skilled therapy to improve functional mobility of dominant arm. OBJECTIVE IMPAIRMENTS: decreased knowledge of condition, decreased ROM, decreased strength, increased muscle spasms, impaired UE functional use, and pain.   ACTIVITY LIMITATIONS: carrying, lifting, sleeping, bathing, dressing, self feeding, and reach over head  PARTICIPATION LIMITATIONS: meal prep, cleaning, laundry, driving, shopping, community activity, occupation, and yard work  PERSONAL FACTORS: 1-2 comorbidities: recent knee surgery   are also affecting patient's functional outcome.   REHAB POTENTIAL: Excellent  CLINICAL DECISION MAKING: Stable/uncomplicated  EVALUATION COMPLEXITY: Low   GOALS: Goals reviewed with patient? Yes  SHORT TERM GOALS: Target date: 11/08/2022    Patient will increase passive right shoulder flexion to 90  degrees Baseline: Goal status: MET  2.  Patient will increase passive shoulder external rotation to 30 degrees right Baseline:  Goal status: ongoing  3.  Patient will wean out of sling when directed by MD Baseline:  Goal status: met  4.  Patient will be independent with base exercise program Baseline:  Goal status: met  LONG TERM GOALS: Target date: 12/21/2022      Patient will reach overhead with 2 pound weight without increased pain in order to perform ADLs Baseline:  Goal status: ongoing  2.  Patient will  reach behind her back without pain in order to perform ADLs Baseline:  Goal status: ongoing  3.  Patient will reach behind her head without pain in order to perform ADLs Baseline:  Goal status: ongoing  4.  Patient will return to light gym program when cleared by MD Baseline:  Goal status: ongoing PLAN:  PT FREQUENCY: 2x/week  PT DURATION: 12 weeks  PLANNED INTERVENTIONS:  Therapeutic exercises, Therapeutic activity, Neuromuscular re-education, Balance training, Gait training, Patient/Family education, Self Care, Joint mobilization, Stair training, DME instructions, Aquatic Therapy, Dry Needling, Electrical stimulation, Cryotherapy, Moist heat, Taping, Manual therapy, and Re-evaluation.   PLAN FOR NEXT SESSION: continue stretching and strengthening.    Dessie Coma, PT 01/01/2023, 9:20 AM

## 2023-01-01 ENCOUNTER — Encounter (HOSPITAL_BASED_OUTPATIENT_CLINIC_OR_DEPARTMENT_OTHER): Payer: Self-pay | Admitting: Physical Therapy

## 2023-01-02 ENCOUNTER — Ambulatory Visit (HOSPITAL_BASED_OUTPATIENT_CLINIC_OR_DEPARTMENT_OTHER): Payer: Self-pay | Admitting: Orthopaedic Surgery

## 2023-01-02 ENCOUNTER — Ambulatory Visit (HOSPITAL_BASED_OUTPATIENT_CLINIC_OR_DEPARTMENT_OTHER): Payer: 59 | Admitting: Orthopaedic Surgery

## 2023-01-02 DIAGNOSIS — M7501 Adhesive capsulitis of right shoulder: Secondary | ICD-10-CM

## 2023-01-02 NOTE — Progress Notes (Signed)
Post Operative Evaluation    Procedure/Date of Surgery: Status post right shoulder labral repair 8/26  Interval History:    Presents today status post right shoulder arthroscopy with labral repair with evidence of capsulitis.  Overall she is working diligently on range of motion and overhead although she is still continuing to be limited with active range of motion  PMH/PSH/Family History/Social History/Meds/Allergies:    Past Medical History:  Diagnosis Date   ADHD (attention deficit hyperactivity disorder)    Arthritis    bilateral hands/fingers   Complication of anesthesia    Woke up extremely cold after anesthesia. Required warm blankets.   Headache    Migraine   Hypothyroidism    Thyroid disease    Past Surgical History:  Procedure Laterality Date   ceas     c-section   KNEE ARTHROSCOPY WITH MENISCAL REPAIR Left 06/04/2022   Procedure: LEFT KNEE ARTHROSCOPY WITH MEDIAL MENISCAL REPAIR;  Surgeon: Huel Cote, MD;  Location: [facility] OR;  Service: Orthopedics;  Laterality: Left;   REDUCTION MAMMAPLASTY Bilateral    age 47   WISDOM TOOTH EXTRACTION     Social History   Socioeconomic History   Marital status: Married    Spouse name: Not on file   Number of children: Not on file   Years of education: Not on file   Highest education level: Not on file  Occupational History   Not on file  Tobacco Use   Smoking status: Never   Smokeless tobacco: Never  Substance and Sexual Activity   Alcohol use: Yes    Comment: occasionally   Drug use: No   Sexual activity: Yes  Other Topics Concern   Not on file  Social History Narrative   Not on file   Social Determinants of Health   Financial Resource Strain: Not on file  Food Insecurity: Not on file  Transportation Needs: Not on file  Physical Activity: Not on file  Stress: Not on file  Social Connections: Not on file   No family history on file. Allergies  Allergen Reactions    Food Other (See Comments)    Eggplant-hives/itching/oral swelling   Ciprofloxacin Itching, Nausea And Vomiting and Rash    Hallucinations/fever   Current Outpatient Medications  Medication Sig Dispense Refill   ALPRAZolam (XANAX) 0.5 MG tablet Take 0.5 mg by mouth daily as needed for anxiety.     aspirin EC 325 MG tablet Take 1 tablet (325 mg total) by mouth daily. 14 tablet 0   COLLAGEN PO Take 1 tablet by mouth daily in the afternoon. (Gummy)     levocetirizine (XYZAL) 5 MG tablet Take 5 mg by mouth daily in the afternoon.     MAGNESIUM PO Take 1 tablet by mouth in the morning and at bedtime. (Gummy)     meloxicam (MOBIC) 15 MG tablet Take 1 tablet (15 mg total) by mouth daily. 30 tablet 2   meloxicam (MOBIC) 15 MG tablet Take 1 tablet (15 mg total) by mouth daily. 14 tablet 0   methocarbamol (ROBAXIN) 500 MG tablet Take 1 tablet (500 mg total) by mouth 4 (four) times daily. 30 tablet 3   MOUNJARO 2.5 MG/0.5ML Pen Inject 2.5 mg into the skin every Monday.     NP THYROID 15 MG tablet Take 15 mg by mouth See admin  instructions. 15 mg + 60 mg=75 mg (take 75 mg dose by mouth every other day--alternating with 90 mg dosage)     NP THYROID 90 MG tablet Take 90 mg by mouth every other day. (Take 90 mg dose by mouth every other day--alternating with 75 mg dosage)     oxyCODONE (ROXICODONE) 5 MG immediate release tablet Take 1 tablet (5 mg total) by mouth every 4 (four) hours as needed for severe pain or breakthrough pain. 5 tablet 0   oxyCODONE (ROXICODONE) 5 MG immediate release tablet Take 1 tablet (5 mg total) by mouth every 4 (four) hours as needed for severe pain. 5 tablet 0   oxyCODONE (ROXICODONE) 5 MG immediate release tablet Take 1 tablet (5 mg total) by mouth every 4 (four) hours as needed for severe pain or breakthrough pain. 5 tablet 0   thyroid (ARMOUR) 60 MG tablet Take 60 mg by mouth See admin instructions. 60 mg + 15 mg=75 mg (take 75 mg dose by mouth every other day--alternating with  90 mg dosage)     Triamcinolone Acetonide (NASACORT AQ NA) Place 1 spray into the nose every evening.     No current facility-administered medications for this visit.   No results found.  Review of Systems:   A ROS was performed including pertinent positives and negatives as documented in the HPI.   Musculoskeletal Exam:    There were no vitals taken for this visit.  Right shoulder incisions are well-appearing without erythema or drainage.  He is transition she can forward elevate to 140 degrees of external rotation at side to 45 degrees internal rotation is to back pocket.  Intact distal neurosensory exam  Imaging:      I personally reviewed and interpreted the radiographs.   Assessment:   Status post right shoulder labral repair unfortunately with evidence of adhesive capsulitis.  Unfortunately she is having a very difficult time with this postoperatively.  She is at the 29-month postop timeframe and to that effect I did discuss that I do believe she is somewhat stagnated with range of motion.  This time given her high demand we did discuss the possibility of an arthroscopic capsular release.  I did recommend a limited debridement.  I did discuss the risks and limitations associated with this.  After discussion she has elected for a right shoulder arthroscopy with capsular release Plan :    -Plan for right shoulder capsular release   After a lengthy discussion of treatment options, including risks, benefits, alternatives, complications of surgical and nonsurgical conservative options, the patient elected surgical repair.   The patient  is aware of the material risks  and complications including, but not limited to injury to adjacent structures, neurovascular injury, infection, numbness, bleeding, implant failure, thermal burns, stiffness, persistent pain, failure to heal, disease transmission from allograft, need for further surgery, dislocation, anesthetic risks, blood clots, risks  of death,and others. The probabilities of surgical success and failure discussed with patient given their particular co-morbidities.The time and nature of expected rehabilitation and recovery was discussed.The patient's questions were all answered preoperatively.  No barriers to understanding were noted. I explained the natural history of the disease process and Rx rationale.  I explained to the patient what I considered to be reasonable expectations given their personal situation.  The final treatment plan was arrived at through a shared patient decision making process model.   I personally saw and evaluated the patient, and participated in the management and treatment plan.  Viviann Spare  Steward Drone, MD Attending Physician, Orthopedic Surgery  This document was dictated using Dragon voice recognition software. A reasonable attempt at proof reading has been made to minimize errors.

## 2023-01-14 ENCOUNTER — Encounter (HOSPITAL_BASED_OUTPATIENT_CLINIC_OR_DEPARTMENT_OTHER): Payer: 59 | Admitting: Physical Therapy

## 2023-01-17 ENCOUNTER — Other Ambulatory Visit: Payer: Self-pay | Admitting: Orthopaedic Surgery

## 2023-01-17 ENCOUNTER — Encounter: Payer: Self-pay | Admitting: Orthopaedic Surgery

## 2023-01-17 DIAGNOSIS — M7501 Adhesive capsulitis of right shoulder: Secondary | ICD-10-CM | POA: Diagnosis not present

## 2023-01-17 MED ORDER — OXYCODONE HCL 5 MG PO TABS: 5.0000 mg | ORAL_TABLET | ORAL | 0 refills | Status: AC | PRN

## 2023-01-17 NOTE — Progress Notes (Signed)
   Date of Surgery: 01/17/2023  INDICATIONS: Ms. Shaut is a 47 y.o.-year-old female with right shoulder arthrofibrosis.  The risk and benefits of the procedure were discussed in detail and documented in the pre-operative evaluation.   PREOPERATIVE DIAGNOSIS: 1.  Right shoulder arthrofibrosis  POSTOPERATIVE DIAGNOSIS: Same.  PROCEDURE: 1.  Right shoulder capsular release  SURGEON: Benancio Deeds MD  ASSISTANT: Kerby Less, ATC  ANESTHESIA:  general  IV FLUIDS AND URINE: See anesthesia record.  ANTIBIOTICS: Ancef  ESTIMATED BLOOD LOSS: 5 mL.  IMPLANTS:  * No surgical log found *  DRAINS: None  CULTURES: None  COMPLICATIONS: none  DESCRIPTION OF PROCEDURE:  Examination under anesthesia revealed forward elevation of 165 degrees following manipulation.  In abduction, there was 90 degrees of external rotation and 70 degrees of internal rotation.  With the arm at the side, there was 70 degrees of external rotation.  There is a 1+ anterior load shift and a 1+ posterior load shift. Arthroscopic findings demonstrated:  Glenoid cartilage: Normal Humeral head: Normal Labrum: Intact repair Biceps insertion: Intact Biceps tendon: Intact Subscapularis insertion: Normal Rotator cuff: Normal  The patient was identified in the preoperative holding area.  The correct site was marked according to universal protocol.  Anesthesia performed an interscalene nerve block.  Ancef was given 1 hour prior to skin incision.  The patient was subsequently taken back to the operating room.  Beachchair position was utilized.  All bony prominences were padded.  Final timeout was performed.  Standard posterior, anterior and anterosuperolateral portals were utilized. The posterior portal was created with an 11-blade and the arthroscope introduced into the glenohumeral joint.  A full diagnostic arthroscopy was performed as described above.  A low anterior portal just above the rolled border of the  subscapularis was identified with a spinal needle, and then instrumenting cannula was placed.    At this time an anterior capsular release was performed with the duckbill biter involving the [facility] L and [facility] L.  This was done to the 6 o'clock position.  The arthroscopy scope was introduced into the anterior portal and a similar posterior capsular release was performed against the 6 clock position.   All instruments were removed, fluid was evacuated, and the arthroscopy portals were closed with 3-0 nylon.  A sterile dressing was applied with Xeroform, gauze, ABD and Medipore tape followed by a Iceman device and a sling with an abduction pillow.    The patient awoke from anesthesia without difficulty and was transferred to PACU in stable condition.     POSTOPERATIVE PLAN: She will be weightbearing and activity as tolerated on the right arm.  She will begin aggressive physical therapy.  I will plan to send her oxycodone for pain control.  She will not be on any aspirin for blood clot prevention with a plan for early ambulation  Benancio Deeds, MD 5:34 PM

## 2023-01-18 ENCOUNTER — Telehealth (HOSPITAL_BASED_OUTPATIENT_CLINIC_OR_DEPARTMENT_OTHER): Payer: Self-pay | Admitting: Orthopaedic Surgery

## 2023-01-18 NOTE — Telephone Encounter (Signed)
Post op

## 2023-01-28 ENCOUNTER — Ambulatory Visit (HOSPITAL_BASED_OUTPATIENT_CLINIC_OR_DEPARTMENT_OTHER): Payer: 59 | Admitting: Physical Therapy

## 2023-01-28 ENCOUNTER — Encounter (HOSPITAL_BASED_OUTPATIENT_CLINIC_OR_DEPARTMENT_OTHER): Payer: Self-pay | Admitting: Physical Therapy

## 2023-01-28 DIAGNOSIS — M25511 Pain in right shoulder: Secondary | ICD-10-CM | POA: Diagnosis not present

## 2023-01-28 DIAGNOSIS — M6281 Muscle weakness (generalized): Secondary | ICD-10-CM

## 2023-01-28 DIAGNOSIS — R262 Difficulty in walking, not elsewhere classified: Secondary | ICD-10-CM

## 2023-01-28 DIAGNOSIS — M25611 Stiffness of right shoulder, not elsewhere classified: Secondary | ICD-10-CM

## 2023-01-28 NOTE — Therapy (Signed)
OUTPATIENT PHYSICAL THERAPY SHOULDER TREATMENT/Progress note    Patient Name: Crystal Parks MRN: [id number] DOB:30-Jun-1975, 47 y.o., female Today's Date: 01/28/2023  END OF SESSION:  PT End of Session - 01/28/23 1020     Visit Number 15    Number of Visits 27    Date for PT Re-Evaluation 03/25/23    Authorization Type [employer]    PT Start Time 1018    PT Stop Time 1100    PT Time Calculation (min) 42 min    Activity Tolerance Patient tolerated treatment well    Behavior During Therapy WFL for tasks assessed/performed                   Past Medical History:  Diagnosis Date   ADHD (attention deficit hyperactivity disorder)    Arthritis    bilateral hands/fingers   Complication of anesthesia    Woke up extremely cold after anesthesia. Required warm blankets.   Headache    Migraine   Hypothyroidism    Thyroid disease    Past Surgical History:  Procedure Laterality Date   ceas     c-section   KNEE ARTHROSCOPY WITH MENISCAL REPAIR Left 06/04/2022   Procedure: LEFT KNEE ARTHROSCOPY WITH MEDIAL MENISCAL REPAIR;  Surgeon: Huel Cote, MD;  Location: [facility] OR;  Service: Orthopedics;  Laterality: Left;   REDUCTION MAMMAPLASTY Bilateral    age 47   WISDOM TOOTH EXTRACTION     Patient Active Problem List   Diagnosis Date Noted   Acute medial meniscus tear of left knee 06/04/2022   Inappropriate sinus tachycardia (HCC) 06/30/2018   Palpitations 06/30/2018   Acute cystitis with hematuria 11/21/2016   Hypothyroidism 11/29/2015   Impaired fasting glucose 11/29/2015   Myalgia 11/29/2015   Female pattern alopecia 11/15/2011   Telogen effluvium 11/15/2011   ALLERGIC RHINITIS 10/18/2009   ASTHMA 10/18/2009   Asthma 10/18/2009    [facility]P: [facility]   REFERRING PROVIDER: Dr Huel Cote   REFERRING DIAG: RIGHT SHOULDER LABRAL TEAR   THERAPY DIAG:  Right shoulder pain, unspecified chronicity  Stiffness of right shoulder, not elsewhere classified  Muscle  weakness (generalized)  Difficulty in walking, not elsewhere classified  Rationale for Evaluation and Treatment: Rehabilitation  ONSET DATE:   Days since surgery: 09/24/2022 Surgical date    PROCEDURE: 1.  Right shoulder anterior labral repair 2.  Right shoulder limited debridement   SUBJECTIVE:                                                                                                                                                                                      SUBJECTIVE STATEMENT: The patient had a  right shoulder capsular release on 12/19. She is doing well at this time. She has minor pain and end range motion   Eval:Patient has long history of right shoulder instability and pain.  She had a right shoulder labral repair on 09/24/2022.  At this time her pain is well-controlled.  She is currently compliant with sling use.  She did recently have a meniscal repair.  Hand dominance: Right  PERTINENT HISTORY: Left knee meniscal repair   PAIN:  Are you having pain? Yes: NPRS scale: 2/10 at worst  Pain location: right anterior shoulder  Pain description: constant, mild ache  Aggravating factors: rest  Relieving factors:   PRECAUTIONS: Shoulder  RED FLAGS: None   WEIGHT BEARING RESTRICTIONS: Yes NWB  FALLS:  Has patient fallen in last 6 months? 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   PLOF: Independent   Hobbies:  Martial arts  [employer] training  PATIENT GOALS:   NEXT MD VISIT:   OBJECTIVE:   DIAGNOSTIC FINDINGS:  Nothing post-op  PATIENT SURVEYS:  FOTO    COGNITION: Overall cognitive status: Within functional limits for tasks assessed     SENSATION: Some impaired sesation into her thumb   POSTURE: Good   UPPER EXTREMITY ROM:   Passive ROM Right eval R  10/28 R eval  Shoulder flexion 80 without resistance  AAROM   110   140  Shoulder extension  30   Shoulder abduction      Shoulder adduction  AAROM 100   Shoulder internal rotation To stomach  60 deg  65  Shoulder external rotation 4 8 AAROM at 0 deg  22  Elbow flexion     Elbow extension     Wrist flexion     Wrist extension     Wrist ulnar deviation     Wrist radial deviation     Wrist pronation     Wrist supination     (Blank rows = not tested)  UPPER EXTREMITY ROM:  Active ROM Right eval Left eval  Shoulder flexion 146 tight at the end    Shoulder extension    Shoulder abduction    Shoulder adduction    Shoulder extension    Shoulder internal rotation L3 C7   Shoulder external rotation No limit   Elbow flexion    Elbow extension    Wrist flexion    Wrist extension    Wrist ulnar deviation    Wrist radial deviation    Wrist pronation    Wrist supination     (Blank rows = not tested)    UPPER EXTREMITY MMT:  MMT Right eval Left eval  Shoulder flexion 5 5  Shoulder extension    Shoulder abduction    Shoulder adduction    Shoulder extension    Shoulder internal rotation 4+ 5  Shoulder external rotation 4+ 5  Middle trapezius    Lower trapezius    Elbow flexion    Elbow extension    Wrist flexion    Wrist extension    Wrist ulnar deviation    Wrist radial deviation    Wrist pronation    Wrist supination    Grip strength     (Blank rows = not tested)   UPPER EXTREMITY NWG:NFAOZH to test   Active 145   Active ER tight behind the head   Active IR to L5 with pain       PALPATION:  TTP of deltoid,  levator, UT, infra   TODAY'S TREATMENT:                                                                                                                                         DATE:  12/30 Manual: PROM into protocol limits  Trigger point release to upper trap and posterior shoulder; Grade II and III PA and AP mobilization    ABC 5 lbs   SL ER 5 lbs   Quadruped rocking  2x10  Reviewed progression but advised not to progress yet   Cable row 3x12 20 lbs  Cable  pull down 3x12 20lbs x1 25 lbs x2   Pallof Press 2x12 10 lbs   Reviewed current program      12/2 Manual: PROM into protocol limits  Trigger point release to upper trap and posterior shoulder; Grade II and III PA and AP mobilization   IR stretch 5x 5 sec hold   Ball v wall CW CCW up/down/ lateral  X20       11/8 Manual: PROM into protocol limits  Trigger point release to upper trap and posterior shoulder; Grade II and III PA and AP mobilization    UBE 2 lbs fwd and 2 back   SL ER 3x10 2 lbs  ABC 2lbs   Bicpes curls 8 lbs 3x10   IR stretch 5x 5 sec hold   11//11 Manual: PROM into protocol limits  Trigger point release to upper trap and posterior shoulder; Grade II and III PA and AP mobilization   UBE 2 lbs fwd and 2 back   Shoulder flexion 2lbs 3x10  SL ER 3x10 1 lbs SL abduction 3x10 1 lb   Supine ABC 1 lb   Row red 2x15 showed patient how to set up for home       PATIENT EDUCATION: Education details: HEP, symptom management,  Person educated: Patient Education method: Explanation, Demonstration, Tactile cues, Verbal cues, and Handouts Education comprehension: verbalized understanding, returned demonstration, verbal cues required, tactile cues required, and needs further education  HOME EXERCISE PROGRAM: Access Code: [email or web address] URL: [email or web address] Date: 09/27/2022 Prepared by: Lorayne Bender  Exercises - Tip Pinch with Putty  - 1 x daily - 7 x weekly - 3 sets - 10 reps - Key Pinch with Putty  - 1 x daily - 7 x weekly - 3 sets - 10 reps - Thumb MCP and IP Flexion with Putty  - 1 x daily - 7 x weekly - 3 sets - 10 reps - Circular Shoulder Pendulum with Table Support  - 1 x daily - 7 x weekly - 3 sets - 10 reps - Standing Single Arm Elbow Flexion with Resistance  - 1 x daily - 7 x weekly - 3 sets - 10 reps  ASSESSMENT:  CLINICAL IMPRESSION:  The patient has progressed very well. She has been working on her exercises on her  own at home. She has progressed the weight in several of her exercises. She had mild end range ER rightness but had full motion following manual therapy. We will continue to progress exercises as tolerated. We added in sport specific training for her to start on. She was advised to take it slow. We also reviewed her gym activity. See below for goal specific progress       Eval: Patient is a 47 year old female status post right labral repair on 09/24/2022.  At this time her pain is well-controlled.  She presents with expected limitations in range of motion, strength, and functional mobility.  She is very active in martial arts prior to her surgery.  She would like to return to those activities.  She would also like to begin resistance training.  She would benefit from skilled therapy to improve functional mobility of dominant arm. OBJECTIVE IMPAIRMENTS: decreased knowledge of condition, decreased ROM, decreased strength, increased muscle spasms, impaired UE functional use, and pain.   ACTIVITY LIMITATIONS: carrying, lifting, sleeping, bathing, dressing, self feeding, and reach over head  PARTICIPATION LIMITATIONS: meal prep, cleaning, laundry, driving, shopping, community activity, occupation, and yard work  PERSONAL FACTORS: 1-2 comorbidities: recent knee surgery   are also affecting patient's functional outcome.   REHAB POTENTIAL: Excellent  CLINICAL DECISION MAKING: Stable/uncomplicated  EVALUATION COMPLEXITY: Low   GOALS: Goals reviewed with patient? Yes  SHORT TERM GOALS: Target date: 11/08/2022    Patient will increase passive right shoulder flexion to 90 degrees Baseline: Goal status: MET  2.  Patient will increase passive shoulder external rotation to 30 degrees right Baseline:  Goal status: achieved 12/30   3.  Patient will wean out of sling when directed by MD Baseline:  Goal status: met  4.  Patient will be independent with base exercise program Baseline:  Goal  status: met  5. Patient will demonstrate 5/5 gross strength  Goal Status: initial   LONG TERM GOALS: Target date: 12/21/2022      Patient will reach overhead with 2 pound weight without increased pain in order to perform ADLs Baseline:  Goal status: progressing 12/30   2.  Patient will reach behind her back without pain in order to perform ADLs Baseline:  Goal status: can go to L3  but c7 on the opposite side ONGOING12/30   3.  Patient will reach behind her head without pain in order to perform ADLs Baseline:  Goal status:achieved 12/30   4.  Patient will return to light gym program when cleared by MD Baseline:  Goal status: ongoing PLAN:  PT FREQUENCY: 2x/week  PT DURATION: 12 weeks  PLANNED INTERVENTIONS:  Therapeutic exercises, Therapeutic activity, Neuromuscular re-education, Balance training, Gait training, Patient/Family education, Self Care, Joint mobilization, Stair training, DME instructions, Aquatic Therapy, Dry Needling, Electrical stimulation, Cryotherapy, Moist heat, Taping, Manual therapy, and Re-evaluation.   PLAN FOR NEXT SESSION: progress towards sports specific activity as tolerated.   Dessie Coma, PT 01/28/2023, 10:22 AM

## 2023-01-29 ENCOUNTER — Encounter (HOSPITAL_BASED_OUTPATIENT_CLINIC_OR_DEPARTMENT_OTHER): Payer: Self-pay | Admitting: Orthopaedic Surgery

## 2023-02-01 ENCOUNTER — Ambulatory Visit (HOSPITAL_BASED_OUTPATIENT_CLINIC_OR_DEPARTMENT_OTHER): Payer: 59 | Admitting: Student

## 2023-02-01 ENCOUNTER — Encounter (HOSPITAL_BASED_OUTPATIENT_CLINIC_OR_DEPARTMENT_OTHER): Payer: Self-pay | Admitting: Student

## 2023-02-01 DIAGNOSIS — M7501 Adhesive capsulitis of right shoulder: Secondary | ICD-10-CM

## 2023-02-01 NOTE — Progress Notes (Signed)
 Post Operative Evaluation    Procedure/Date of Surgery: Right shoulder capsular release 12/19  Interval History:   Patient presents today 2 weeks status post right shoulder capsular release.  Overall she reports doing extremely well.  She has been working with physical therapy and has almost regained full range of motion.  Denies any pain without use of pain medication.  No concerns involving the shoulder incisions with no fever or chills.   PMH/PSH/Family History/Social History/Meds/Allergies:    Past Medical History:  Diagnosis Date   ADHD (attention deficit hyperactivity disorder)    Arthritis    bilateral hands/fingers   Complication of anesthesia    Woke up extremely cold after anesthesia. Required warm blankets.   Headache    Migraine   Hypothyroidism    Thyroid  disease    Past Surgical History:  Procedure Laterality Date   ceas     c-section   KNEE ARTHROSCOPY WITH MENISCAL REPAIR Left 06/04/2022   Procedure: LEFT KNEE ARTHROSCOPY WITH MEDIAL MENISCAL REPAIR;  Surgeon: Genelle Standing, MD;  Location: [facility] OR;  Service: Orthopedics;  Laterality: Left;   REDUCTION MAMMAPLASTY Bilateral    age 48   WISDOM TOOTH EXTRACTION     Social History   Socioeconomic History   Marital status: Married    Spouse name: Not on file   Number of children: Not on file   Years of education: Not on file   Highest education level: Not on file  Occupational History   Not on file  Tobacco Use   Smoking status: Never   Smokeless tobacco: Never  Substance and Sexual Activity   Alcohol use: Yes    Comment: occasionally   Drug use: No   Sexual activity: Yes  Other Topics Concern   Not on file  Social History Narrative   Not on file   Social Drivers of Health   Financial Resource Strain: Not on file  Food Insecurity: Not on file  Transportation Needs: Not on file  Physical Activity: Not on file  Stress: Not on file  Social Connections: Not on  file   History reviewed. No pertinent family history. Allergies  Allergen Reactions   Food Other (See Comments)    Eggplant-hives/itching/oral swelling   Ciprofloxacin Itching, Nausea And Vomiting and Rash    Hallucinations/fever   Current Outpatient Medications  Medication Sig Dispense Refill   ALPRAZolam (XANAX) 0.5 MG tablet Take 0.5 mg by mouth daily as needed for anxiety.     aspirin  EC 325 MG tablet Take 1 tablet (325 mg total) by mouth daily. 14 tablet 0   COLLAGEN PO Take 1 tablet by mouth daily in the afternoon. (Gummy)     levocetirizine (XYZAL) 5 MG tablet Take 5 mg by mouth daily in the afternoon.     MAGNESIUM PO Take 1 tablet by mouth in the morning and at bedtime. (Gummy)     meloxicam  (MOBIC ) 15 MG tablet Take 1 tablet (15 mg total) by mouth daily. 30 tablet 2   meloxicam  (MOBIC ) 15 MG tablet Take 1 tablet (15 mg total) by mouth daily. 14 tablet 0   methocarbamol  (ROBAXIN ) 500 MG tablet Take 1 tablet (500 mg total) by mouth 4 (four) times daily. 30 tablet 3   MOUNJARO 2.5 MG/0.5ML Pen Inject 2.5 mg into the skin every Monday.  NP THYROID  15 MG tablet Take 15 mg by mouth See admin instructions. 15 mg + 60 mg=75 mg (take 75 mg dose by mouth every other day--alternating with 90 mg dosage)     NP THYROID  90 MG tablet Take 90 mg by mouth every other day. (Take 90 mg dose by mouth every other day--alternating with 75 mg dosage)     oxyCODONE  (ROXICODONE ) 5 MG immediate release tablet Take 1 tablet (5 mg total) by mouth every 4 (four) hours as needed for severe pain or breakthrough pain. 5 tablet 0   oxyCODONE  (ROXICODONE ) 5 MG immediate release tablet Take 1 tablet (5 mg total) by mouth every 4 (four) hours as needed for severe pain. 5 tablet 0   oxyCODONE  (ROXICODONE ) 5 MG immediate release tablet Take 1 tablet (5 mg total) by mouth every 4 (four) hours as needed for severe pain (pain score 7-10) or breakthrough pain. 15 tablet 0   thyroid  (ARMOUR) 60 MG tablet Take 60 mg by  mouth See admin instructions. 60 mg + 15 mg=75 mg (take 75 mg dose by mouth every other day--alternating with 90 mg dosage)     Triamcinolone  Acetonide (NASACORT  AQ NA) Place 1 spray into the nose every evening.     No current facility-administered medications for this visit.   No results found.  Review of Systems:   A ROS was performed including pertinent positives and negatives as documented in the HPI.   Musculoskeletal Exam:    There were no vitals taken for this visit.  Right shoulder incisions are well-appearing without evidence of erythema or drainage.  Active shoulder ROM to 160 degrees forward flexion, 30 degrees external rotation, and internal rotation to L1.  Can passively flex to 170 degrees.  Imaging:     Assessment:   2 weeks status post right shoulder capsular release overall progressing extremely well.  Has been working with physical therapy particular for range of motion exercises and has almost regained full ROM with the exception of internal rotation which continues to improve.  She will continue to benefit from PT for progression of strengthening of the shoulder.  Will plan to see her back in another 4 weeks.  Plan :    -Continue physical therapy and return to clinic in 4 weeks      I personally saw and evaluated the patient, and participated in the management and treatment plan.  Leonce Reveal, PA-C Orthopedics

## 2023-02-12 ENCOUNTER — Telehealth (HOSPITAL_BASED_OUTPATIENT_CLINIC_OR_DEPARTMENT_OTHER): Payer: Self-pay | Admitting: Orthopaedic Surgery

## 2023-02-12 NOTE — Telephone Encounter (Signed)
 Left message for patient to call the office back to sch an appointment this week

## 2023-02-13 ENCOUNTER — Ambulatory Visit (HOSPITAL_BASED_OUTPATIENT_CLINIC_OR_DEPARTMENT_OTHER): Payer: 59 | Admitting: Orthopaedic Surgery

## 2023-02-13 DIAGNOSIS — M25562 Pain in left knee: Secondary | ICD-10-CM | POA: Diagnosis not present

## 2023-02-13 DIAGNOSIS — S83242A Other tear of medial meniscus, current injury, left knee, initial encounter: Secondary | ICD-10-CM | POA: Diagnosis not present

## 2023-02-13 MED ORDER — LIDOCAINE HCL 1 % IJ SOLN
4.0000 mL | INTRAMUSCULAR | Status: AC | PRN
  Administered 2023-02-13: 4 mL

## 2023-02-13 MED ORDER — TRIAMCINOLONE ACETONIDE 40 MG/ML IJ SUSP
80.0000 mg | INTRAMUSCULAR | Status: AC | PRN
  Administered 2023-02-13: 80 mg via INTRA_ARTICULAR

## 2023-02-13 NOTE — Progress Notes (Signed)
 Post Operative Evaluation    Procedure/Date of Surgery: Status post left knee medial meniscal repair 5/6  Interval History:    Presents today status post left knee medial meniscal repair.  She has been experiencing some clunking and catching particularly as she moves to the left.  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 ADHD (attention deficit hyperactivity disorder)   . Arthritis    bilateral hands/fingers  . Complication of anesthesia    Woke up extremely cold after anesthesia. Required warm blankets.  . Headache    Migraine  . Hypothyroidism   . Thyroid  disease    Past Surgical History:  Procedure Laterality Date  . ceas     c-section  . KNEE ARTHROSCOPY WITH MENISCAL REPAIR Left 06/04/2022   Procedure: LEFT KNEE ARTHROSCOPY WITH MEDIAL MENISCAL REPAIR;  Surgeon: Wilhelmenia Harada, MD;  Location: [facility] OR;  Service: Orthopedics;  Laterality: Left;  . REDUCTION MAMMAPLASTY Bilateral    age 48  . WISDOM TOOTH EXTRACTION     Social History   Socioeconomic History  . Marital status: Married    Spouse name: Not on file  . Number of children: Not on file  . Years of education: Not on file  . Highest education level: Not on file  Occupational History  . Not on file  Tobacco Use  . Smoking status: Never  . Smokeless tobacco: Never  Substance and Sexual Activity  . Alcohol use: Yes    Comment: occasionally  . Drug use: No  . Sexual activity: Yes  Other Topics Concern  . Not on file  Social History Narrative  . Not on file   Social Drivers of Health   Financial Resource Strain: Not on file  Food Insecurity: Not on file  Transportation Needs: Not on file  Physical Activity: Not on file  Stress: Not on file  Social Connections: Not on file   No family history on file. Allergies   Allergen Reactions  . Food Other (See Comments)    Eggplant-hives/itching/oral swelling  . Ciprofloxacin Itching, Nausea And Vomiting and Rash    Hallucinations/fever   Current Outpatient Medications  Medication Sig Dispense Refill  . ALPRAZolam (XANAX) 0.5 MG tablet Take 0.5 mg by mouth daily as needed for anxiety.    . aspirin  EC 325 MG tablet Take 1 tablet (325 mg total) by mouth daily. 14 tablet 0  . COLLAGEN PO Take 1 tablet by mouth daily in the afternoon. (Gummy)    . levocetirizine (XYZAL) 5 MG tablet Take 5 mg by mouth daily in the afternoon.    Aaron Aas MAGNESIUM PO Take 1 tablet by mouth in the morning and at bedtime. (Gummy)    . meloxicam  (MOBIC ) 15 MG tablet Take 1 tablet (15 mg total) by mouth daily. 30 tablet 2  . meloxicam  (MOBIC ) 15 MG tablet Take 1 tablet (15 mg total) by mouth daily. 14 tablet 0  . methocarbamol  (ROBAXIN ) 500 MG tablet Take 1 tablet (500 mg total) by mouth 4 (four) times daily. 30 tablet 3  . MOUNJARO 2.5 MG/0.5ML Pen Inject 2.5 mg into the skin every Monday.    . NP THYROID  15 MG tablet Take 15 mg  by mouth See admin instructions. 15 mg + 60 mg=75 mg (take 75 mg dose by mouth every other day--alternating with 90 mg dosage)    . NP THYROID  90 MG tablet Take 90 mg by mouth every other day. (Take 90 mg dose by mouth every other day--alternating with 75 mg dosage)    . oxyCODONE  (ROXICODONE ) 5 MG immediate release tablet Take 1 tablet (5 mg total) by mouth every 4 (four) hours as needed for severe pain or breakthrough pain. 5 tablet 0  . oxyCODONE  (ROXICODONE ) 5 MG immediate release tablet Take 1 tablet (5 mg total) by mouth every 4 (four) hours as needed for severe pain. 5 tablet 0  . oxyCODONE  (ROXICODONE ) 5 MG immediate release tablet Take 1 tablet (5 mg total) by mouth every 4 (four) hours as needed for severe pain (pain score 7-10) or breakthrough pain. 15 tablet 0  . thyroid  (ARMOUR) 60 MG tablet Take 60 mg by mouth See admin instructions. 60 mg + 15 mg=75 mg  (take 75 mg dose by mouth every other day--alternating with 90 mg dosage)    . Triamcinolone  Acetonide (NASACORT  AQ NA) Place 1 spray into the nose every evening.     No current facility-administered medications for this visit.   No results found.  Review of Systems:   A ROS was performed including pertinent positives and negatives as documented in the HPI.   Musculoskeletal Exam:    There were no vitals taken for this visit.  Left knee without tenderness about the medial joint line although there is a reproducible clunk which appears to be possibly patellofemoral in nature.  Incisions are healed distal neurosensory exam is intact Imaging:      I personally reviewed and interpreted the radiographs.   Assessment:   Status post left knee medial meniscal repair.  I did describe that there could be 2 possible etiologies of the clunking including a nonhealed meniscus versus hypertrophied plica.  In order to discern the difference between these have recommend an ultrasound-guided injection of the left knee which she did actually get immediate relief from leading me to believe this is more plica in nature.  I will plan to see her back in 2 months for follow-up Plan :    -Return to clinic 2 months for reassessment   Procedure Note  Patient: Crystal Parks             Date of Birth: November 09, 1975           MRN: [id number]             Visit Date: 02/13/2023  Procedures: Visit Diagnoses: No diagnosis found.  Large Joint Inj: L knee on 02/13/2023 12:52 PM Indications: pain Details: 22 G 1.5 in needle, ultrasound-guided anterior approach  Arthrogram: No  Medications: 4 mL lidocaine  1 %; 80 mg triamcinolone  acetonide 40 MG/ML Outcome: tolerated well, no immediate complications Procedure, treatment alternatives, risks and benefits explained, specific risks discussed. Consent was given by the patient. Immediately prior to procedure a time out was called to verify the correct patient,  procedure, equipment, support staff and site/side marked as required. Patient was prepped and draped in the usual sterile fashion.       I personally saw and evaluated the patient, and participated in the management and treatment plan.  Wilhelmenia Harada, MD Attending Physician, Orthopedic Surgery  This document was dictated using Dragon voice recognition software. A reasonable attempt at proof reading has been made to minimize errors.

## 2023-02-27 ENCOUNTER — Encounter (HOSPITAL_BASED_OUTPATIENT_CLINIC_OR_DEPARTMENT_OTHER): Payer: 59 | Admitting: Orthopaedic Surgery

## 2023-04-15 ENCOUNTER — Encounter (HOSPITAL_BASED_OUTPATIENT_CLINIC_OR_DEPARTMENT_OTHER): Payer: 59 | Admitting: Orthopaedic Surgery

## 2023-07-15 ENCOUNTER — Telehealth (HOSPITAL_BASED_OUTPATIENT_CLINIC_OR_DEPARTMENT_OTHER): Payer: Self-pay | Admitting: Orthopaedic Surgery

## 2023-07-15 NOTE — Telephone Encounter (Signed)
 Patient states that her surgery shoulder is giving her trouble and wants to know if she can come in or see Cleora Daft

## 2023-07-22 ENCOUNTER — Encounter (HOSPITAL_BASED_OUTPATIENT_CLINIC_OR_DEPARTMENT_OTHER): Payer: Self-pay | Admitting: Student

## 2023-07-22 ENCOUNTER — Ambulatory Visit (HOSPITAL_BASED_OUTPATIENT_CLINIC_OR_DEPARTMENT_OTHER): Admitting: Student

## 2023-07-22 ENCOUNTER — Ambulatory Visit (HOSPITAL_BASED_OUTPATIENT_CLINIC_OR_DEPARTMENT_OTHER)

## 2023-07-22 DIAGNOSIS — M25511 Pain in right shoulder: Secondary | ICD-10-CM | POA: Diagnosis not present

## 2023-07-22 DIAGNOSIS — M7501 Adhesive capsulitis of right shoulder: Secondary | ICD-10-CM | POA: Diagnosis not present

## 2023-07-22 MED ORDER — LIDOCAINE HCL 1 % IJ SOLN
4.0000 mL | INTRAMUSCULAR | Status: AC | PRN
  Administered 2023-07-22: 4 mL

## 2023-07-22 MED ORDER — TRIAMCINOLONE ACETONIDE 40 MG/ML IJ SUSP
2.0000 mL | INTRAMUSCULAR | Status: AC | PRN
  Administered 2023-07-22: 2 mL via INTRA_ARTICULAR

## 2023-07-22 NOTE — Progress Notes (Signed)
 Chief Complaint: Right shoulder pain     History of Present Illness:    Crystal Parks is a 48 y.o. female who presents with acute on chronic right shoulder pain.  She does have a history of a right shoulder capsular release in December 2024.  She reports that a few weeks ago while sparring in martial arts, she developed sudden pain in the right shoulder after using her right arm for a blocking maneuver.  States that overall her strength feels well-maintained, but she continues to experience pain in the lateral and posterior shoulder that is described as a constant ache.  Certain movements elicit a sharp pain.  She is unable to sleep on her right side for long periods of time.  Denies any numbness or tingling or neck pain.   Surgical History:   Right shoulder capsular release 01/17/2023 Right shoulder arthroscopy with labral repair 09/24/2022  PMH/PSH/Family History/Social History/Meds/Allergies:    Past Medical History:  Diagnosis Date   ADHD (attention deficit hyperactivity disorder)    Arthritis    bilateral hands/fingers   Complication of anesthesia    Woke up extremely cold after anesthesia. Required warm blankets.   Headache    Migraine   Hypothyroidism    Thyroid  disease    Past Surgical History:  Procedure Laterality Date   ceas     c-section   KNEE ARTHROSCOPY WITH MENISCAL REPAIR Left 06/04/2022   Procedure: LEFT KNEE ARTHROSCOPY WITH MEDIAL MENISCAL REPAIR;  Surgeon: Genelle Standing, MD;  Location: [facility] OR;  Service: Orthopedics;  Laterality: Left;   REDUCTION MAMMAPLASTY Bilateral    age 89   WISDOM TOOTH EXTRACTION     Social History   Socioeconomic History   Marital status: Married    Spouse name: Not on file   Number of children: Not on file   Years of education: Not on file   Highest education level: Not on file  Occupational History   Not on file  Tobacco Use   Smoking status: Never   Smokeless tobacco: Never   Substance and Sexual Activity   Alcohol use: Yes    Comment: occasionally   Drug use: No   Sexual activity: Yes  Other Topics Concern   Not on file  Social History Narrative   Not on file   Social Drivers of Health   Financial Resource Strain: Not on file  Food Insecurity: Not on file  Transportation Needs: Not on file  Physical Activity: Not on file  Stress: Not on file  Social Connections: Not on file   History reviewed. No pertinent family history. Allergies  Allergen Reactions   Food Other (See Comments)    Eggplant-hives/itching/oral swelling   Ciprofloxacin Itching, Nausea And Vomiting and Rash    Hallucinations/fever   Current Outpatient Medications  Medication Sig Dispense Refill   ALPRAZolam (XANAX) 0.5 MG tablet Take 0.5 mg by mouth daily as needed for anxiety.     aspirin  EC 325 MG tablet Take 1 tablet (325 mg total) by mouth daily. 14 tablet 0   COLLAGEN PO Take 1 tablet by mouth daily in the afternoon. (Gummy)     levocetirizine (XYZAL) 5 MG tablet Take 5 mg by mouth daily in the afternoon.     MAGNESIUM PO Take 1 tablet by mouth in the morning and at  bedtime. (Gummy)     meloxicam  (MOBIC ) 15 MG tablet Take 1 tablet (15 mg total) by mouth daily. 30 tablet 2   meloxicam  (MOBIC ) 15 MG tablet Take 1 tablet (15 mg total) by mouth daily. 14 tablet 0   methocarbamol  (ROBAXIN ) 500 MG tablet Take 1 tablet (500 mg total) by mouth 4 (four) times daily. 30 tablet 3   MOUNJARO 2.5 MG/0.5ML Pen Inject 2.5 mg into the skin every Monday.     NP THYROID  15 MG tablet Take 15 mg by mouth See admin instructions. 15 mg + 60 mg=75 mg (take 75 mg dose by mouth every other day--alternating with 90 mg dosage)     NP THYROID  90 MG tablet Take 90 mg by mouth every other day. (Take 90 mg dose by mouth every other day--alternating with 75 mg dosage)     oxyCODONE  (ROXICODONE ) 5 MG immediate release tablet Take 1 tablet (5 mg total) by mouth every 4 (four) hours as needed for severe pain or  breakthrough pain. 5 tablet 0   oxyCODONE  (ROXICODONE ) 5 MG immediate release tablet Take 1 tablet (5 mg total) by mouth every 4 (four) hours as needed for severe pain. 5 tablet 0   oxyCODONE  (ROXICODONE ) 5 MG immediate release tablet Take 1 tablet (5 mg total) by mouth every 4 (four) hours as needed for severe pain (pain score 7-10) or breakthrough pain. 15 tablet 0   thyroid  (ARMOUR) 60 MG tablet Take 60 mg by mouth See admin instructions. 60 mg + 15 mg=75 mg (take 75 mg dose by mouth every other day--alternating with 90 mg dosage)     Triamcinolone  Acetonide (NASACORT  AQ NA) Place 1 spray into the nose every evening.     No current facility-administered medications for this visit.   No results found.  Review of Systems:   A ROS was performed including pertinent positives and negatives as documented in the HPI.  Physical Exam :   Constitutional: NAD and appears stated age Neurological: Alert and oriented Psych: Appropriate affect and cooperative There were no vitals taken for this visit.   Comprehensive Musculoskeletal Exam:    Right shoulder exam demonstrates tenderness with palpation over the lateral deltoid and posterior glenohumeral joint.  Active right shoulder range of motion is to 170 degrees forward flexion, 80 degrees external rotation, and internal rotation to T12 compared to T6 contralaterally.  Negative empty can and impingement test.  Pain with liftoff.  Imaging:   Xray (right shoulder 3 views): No evidence of acute bony abnormality.  Mild degenerative changes of the glenohumeral and AC joints.   I personally reviewed and interpreted the radiographs.   Assessment:   48 y.o. female with history of right shoulder adhesive capsulitis and capsular release surgery presenting with acute right shoulder pain.  No significant findings or changes on x-rays.  She does retain good range of motion with flexion and external rotation, although this does elicit some discomfort in  terminal motion.  At this point I am suspicious that her symptoms are more consistent with a rotator cuff flareup than adhesive capsulitis or a labral injury, therefore I would like to begin treatment with a subacromial injection to see if this helps alleviate some of her symptoms.  Patient is agreeable and subacromial injection was performed without any complication.  Will plan to monitor how she does with this, and would likely consider a glenohumeral injection versus further imaging if symptoms persist.   Plan :    - Right shoulder  subacromial cortisone injection performed today - Return to clinic within 2 weeks if no significant improvement in symptoms    Procedure Note  Patient: Crystal Parks             Date of Birth: 08/14/1975           MRN: [id number]             Visit Date: 07/22/2023  Procedures: Visit Diagnoses:  1. Acute pain of right shoulder     Large Joint Inj: R subacromial bursa on 07/22/2023 1:10 PM Indications: pain Details: 22 G 1.5 in needle, posterior approach Medications: 4 mL lidocaine  1 %; 2 mL triamcinolone  acetonide 40 MG/ML Outcome: tolerated well, no immediate complications Procedure, treatment alternatives, risks and benefits explained, specific risks discussed. Consent was given by the patient. Immediately prior to procedure a time out was called to verify the correct patient, procedure, equipment, support staff and site/side marked as required. Patient was prepped and draped in the usual sterile fashion.       I personally saw and evaluated the patient, and participated in the management and treatment plan.  Leonce Reveal, PA-C Orthopedics

## 2023-12-02 ENCOUNTER — Encounter: Payer: Self-pay | Admitting: Radiology
